# Patient Record
Sex: Female | Born: 1980 | Race: White | Hispanic: No | Marital: Married | Smoking: Never smoker
Health system: Southern US, Community
[De-identification: ages and names within clinical notes are randomized; demographics above are authoritative.]

## PROBLEM LIST (undated history)

## (undated) DIAGNOSIS — Z789 Other specified health status: Secondary | ICD-10-CM

## (undated) HISTORY — PX: TONSILLECTOMY: SUR1361

---

## 2003-09-11 ENCOUNTER — Other Ambulatory Visit: Admission: RE | Admit: 2003-09-11 | Discharge: 2003-09-11 | Payer: Self-pay | Admitting: *Deleted

## 2004-01-13 ENCOUNTER — Other Ambulatory Visit: Admission: RE | Admit: 2004-01-13 | Discharge: 2004-01-13 | Payer: Self-pay | Admitting: *Deleted

## 2004-06-22 ENCOUNTER — Other Ambulatory Visit: Admission: RE | Admit: 2004-06-22 | Discharge: 2004-06-22 | Payer: Self-pay | Admitting: *Deleted

## 2011-11-10 ENCOUNTER — Other Ambulatory Visit: Payer: Self-pay

## 2012-02-17 ENCOUNTER — Inpatient Hospital Stay (HOSPITAL_COMMUNITY): Payer: BC Managed Care – PPO

## 2012-02-17 ENCOUNTER — Encounter (HOSPITAL_COMMUNITY): Payer: Self-pay | Admitting: *Deleted

## 2012-02-17 ENCOUNTER — Inpatient Hospital Stay (HOSPITAL_COMMUNITY)
Admission: AD | Admit: 2012-02-17 | Discharge: 2012-02-18 | DRG: 380 | Disposition: A | Discharge status: Home / Self Care | Payer: BC Managed Care – PPO | Source: Ambulatory Visit | Attending: Obstetrics & Gynecology | Admitting: Obstetrics & Gynecology

## 2012-02-17 DIAGNOSIS — O4590 Premature separation of placenta, unspecified, unspecified trimester: Secondary | ICD-10-CM | POA: Diagnosis present

## 2012-02-17 DIAGNOSIS — IMO0001 Reserved for inherently not codable concepts without codable children: Secondary | ICD-10-CM

## 2012-02-17 DIAGNOSIS — O4692 Antepartum hemorrhage, unspecified, second trimester: Secondary | ICD-10-CM

## 2012-02-17 DIAGNOSIS — O4290 Premature rupture of membranes, unspecified as to length of time between rupture and onset of labor, unspecified weeks of gestation: Secondary | ICD-10-CM | POA: Diagnosis present

## 2012-02-17 DIAGNOSIS — O021 Missed abortion: Principal | ICD-10-CM | POA: Diagnosis present

## 2012-02-17 HISTORY — DX: Other specified health status: Z78.9

## 2012-02-17 LAB — CBC
HCT: 38 % (ref 36.0–46.0)
HCT: 35.2 % — ABNORMAL LOW (ref 36.0–46.0)
Hemoglobin: 12.2 g/dL (ref 12.0–15.0)
RBC: 4.28 MIL/uL (ref 3.87–5.11)
RDW: 13.1 % (ref 11.5–15.5)
RDW: 13 % (ref 11.5–15.5)
WBC: 14.4 10*3/uL — ABNORMAL HIGH (ref 4.0–10.5)
WBC: 16.4 10*3/uL — ABNORMAL HIGH (ref 4.0–10.5)

## 2012-02-17 LAB — COMPREHENSIVE METABOLIC PANEL
AST: 14 U/L (ref 0–37)
Albumin: 2.8 g/dL — ABNORMAL LOW (ref 3.5–5.2)
Calcium: 8.9 mg/dL (ref 8.4–10.5)
Creatinine, Ser: 0.56 mg/dL (ref 0.50–1.10)
GFR calc non Af Amer: >90 mL/min (ref >90)

## 2012-02-17 LAB — ABO/RH: ABO/RH(D): B POS

## 2012-02-17 LAB — DIC (DISSEMINATED INTRAVASCULAR COAGULATION)PANEL
INR: 0.99 (ref 0.00–1.49)
Platelets: 191 10*3/uL (ref 150–400)
Smear Review: NONE SEEN

## 2012-02-17 LAB — DIC (DISSEMINATED INTRAVASCULAR COAGULATION) PANEL
D-Dimer, Quant: 1.32 ug/mL-FEU — ABNORMAL HIGH (ref 0.00–0.48)
Fibrinogen: 323 mg/dL (ref 204–475)

## 2012-02-17 LAB — RPR: RPR Ser Ql: NONREACTIVE

## 2012-02-17 LAB — PREPARE RBC (CROSSMATCH)

## 2012-02-17 MED ORDER — TETANUS-DIPHTH-ACELL PERTUSSIS 5-2.5-18.5 LF-MCG/0.5 IM SUSP: 0.5000 mL | Freq: Once | INTRAMUSCULAR | Status: DC

## 2012-02-17 MED ORDER — FLEET ENEMA 7-19 GM/118ML RE ENEM: 1.0000 | ENEMA | Freq: Every day | RECTAL | Status: DC | PRN

## 2012-02-17 MED ORDER — OXYCODONE-ACETAMINOPHEN 5-325 MG PO TABS: 1.0000 | ORAL_TABLET | ORAL | Status: DC | PRN

## 2012-02-17 MED ORDER — BUTORPHANOL TARTRATE 1 MG/ML IJ SOLN
1.0000 mg | Freq: Once | INTRAMUSCULAR | Status: AC
  Administered 2012-02-17: 1 mg via INTRAVENOUS

## 2012-02-17 MED ORDER — BENZOCAINE-MENTHOL 20-0.5 % EX AERO: 1.0000 "application " | INHALATION_SPRAY | CUTANEOUS | Status: DC | PRN

## 2012-02-17 MED ORDER — LANOLIN HYDROUS EX OINT: TOPICAL_OINTMENT | CUTANEOUS | Status: DC | PRN

## 2012-02-17 MED ORDER — ONDANSETRON HCL 4 MG/2ML IJ SOLN: 4.0000 mg | Freq: Four times a day (QID) | INTRAMUSCULAR | Status: DC | PRN

## 2012-02-17 MED ORDER — SIMETHICONE 80 MG PO CHEW: 80.0000 mg | CHEWABLE_TABLET | ORAL | Status: DC | PRN

## 2012-02-17 MED ORDER — ONDANSETRON HCL 4 MG/2ML IJ SOLN: 4.0000 mg | INTRAMUSCULAR | Status: DC | PRN

## 2012-02-17 MED ORDER — CITRIC ACID-SODIUM CITRATE 334-500 MG/5ML PO SOLN: 30.0000 mL | ORAL | Status: DC | PRN

## 2012-02-17 MED ORDER — ACETAMINOPHEN 325 MG PO TABS: 650.0000 mg | ORAL_TABLET | ORAL | Status: DC | PRN

## 2012-02-17 MED ORDER — LACTATED RINGERS IV BOLUS (SEPSIS)
1000.0000 mL | Freq: Once | INTRAVENOUS | Status: AC
  Administered 2012-02-17: 1000 mL via INTRAVENOUS

## 2012-02-17 MED ORDER — LACTATED RINGERS IV SOLN: INTRAVENOUS | Status: DC

## 2012-02-17 MED ORDER — ZOLPIDEM TARTRATE 5 MG PO TABS: 5.0000 mg | ORAL_TABLET | Freq: Every evening | ORAL | Status: DC | PRN

## 2012-02-17 MED ORDER — SODIUM CHLORIDE 0.9 % IV SOLN
2.0000 g | Freq: Four times a day (QID) | INTRAVENOUS | Status: DC
  Administered 2012-02-17: 2 g via INTRAVENOUS
  Filled 2012-02-17 (×4): qty 2000

## 2012-02-17 MED ORDER — IBUPROFEN 600 MG PO TABS
600.0000 mg | ORAL_TABLET | Freq: Four times a day (QID) | ORAL | Status: DC
  Administered 2012-02-17 – 2012-02-18 (×3): 600 mg via ORAL
  Filled 2012-02-17 (×3): qty 1

## 2012-02-17 MED ORDER — AZITHROMYCIN 250 MG PO TABS: 500.0000 mg | ORAL_TABLET | Freq: Every day | ORAL | Status: DC

## 2012-02-17 MED ORDER — DEXTROSE 5 % IV SOLN
500.0000 mg | INTRAVENOUS | Status: DC
  Administered 2012-02-17: 500 mg via INTRAVENOUS
  Filled 2012-02-17: qty 500

## 2012-02-17 MED ORDER — AMOXICILLIN 500 MG PO CAPS: 500.0000 mg | ORAL_CAPSULE | Freq: Three times a day (TID) | ORAL | Status: DC

## 2012-02-17 MED ORDER — LACTATED RINGERS IV SOLN: 500.0000 mL | INTRAVENOUS | Status: DC | PRN

## 2012-02-17 MED ORDER — PRENATAL MULTIVITAMIN CH
1.0000 | ORAL_TABLET | Freq: Every day | ORAL | Status: DC
  Administered 2012-02-17: 1 via ORAL
  Filled 2012-02-17: qty 1

## 2012-02-17 MED ORDER — IBUPROFEN 600 MG PO TABS: 600.0000 mg | ORAL_TABLET | Freq: Four times a day (QID) | ORAL | Status: DC | PRN

## 2012-02-17 MED ORDER — DIBUCAINE 1 % RE OINT: 1.0000 "application " | TOPICAL_OINTMENT | RECTAL | Status: DC | PRN

## 2012-02-17 MED ORDER — ONDANSETRON HCL 4 MG PO TABS: 4.0000 mg | ORAL_TABLET | ORAL | Status: DC | PRN

## 2012-02-17 MED ORDER — WITCH HAZEL-GLYCERIN EX PADS: 1.0000 "application " | MEDICATED_PAD | CUTANEOUS | Status: DC | PRN

## 2012-02-17 MED ORDER — BISACODYL 10 MG RE SUPP: 10.0000 mg | Freq: Every day | RECTAL | Status: DC | PRN

## 2012-02-17 MED ORDER — FLEET ENEMA 7-19 GM/118ML RE ENEM: 1.0000 | ENEMA | RECTAL | Status: DC | PRN

## 2012-02-17 MED ORDER — OXYTOCIN 40 UNITS IN LACTATED RINGERS INFUSION - SIMPLE MED
62.5000 mL/h | INTRAVENOUS | Status: DC
  Filled 2012-02-17 (×2): qty 1000

## 2012-02-17 MED ORDER — LIDOCAINE HCL (PF) 1 % IJ SOLN: 30.0000 mL | INTRAMUSCULAR | Status: DC | PRN

## 2012-02-17 MED ORDER — DIPHENHYDRAMINE HCL 25 MG PO CAPS: 25.0000 mg | ORAL_CAPSULE | Freq: Four times a day (QID) | ORAL | Status: DC | PRN

## 2012-02-17 MED ORDER — OXYTOCIN BOLUS FROM INFUSION: 500.0000 mL | INTRAVENOUS | Status: DC

## 2012-02-17 MED ORDER — BUTORPHANOL TARTRATE 1 MG/ML IJ SOLN
INTRAMUSCULAR | Status: AC
  Administered 2012-02-17: 1 mg via INTRAVENOUS
  Filled 2012-02-17: qty 1

## 2012-02-17 MED ORDER — SENNOSIDES-DOCUSATE SODIUM 8.6-50 MG PO TABS
2.0000 | ORAL_TABLET | Freq: Every day | ORAL | Status: DC
  Administered 2012-02-17: 2 via ORAL

## 2012-02-17 NOTE — Progress Notes (Signed)
I received a call from pt's nurse after baby had delivered.  Judeth Cornfield and Kipp Brood were holding their son and were appropriately grieving.  They asked for a prayer for their baby.  I offered a prayer and I also created a space for them to voice their own prayers.  They requested that a priest be called.  I contacted the parish that is on-call this month and I am waiting for a phone call back to let me know if/when they will be able to come.    [employer] Pager, [phone] 1:45 PM   02/17/12 1300  Clinical Encounter Type  Visited With Patient and family together  Visit Type Spiritual support;Death  Referral From Nurse  Spiritual Encounters  Spiritual Needs Prayer

## 2012-02-17 NOTE — MAU Note (Signed)
Cramping and bleeding

## 2012-02-17 NOTE — MAU Provider Note (Signed)
Chief Complaint:  Vaginal Bleeding   None     HPI: Donna Mitchell is a 32 y.o. G1P0 at 85w1dwho presents to maternity admissions reporting cramping x4-5 hours with onset of vaginal bleeding described as bright red, when wiping, and staining her underwear. Upon arrival in MAU, pt leaned over side of bed to vomit, was unable to vomit but felt a pop and gush of fluid.  Large amount of bright red blood and clear fluid leakage noted on bed.  Pad place under pt.  She reports fetal movement prior to arrival in MAU, denies vaginal itching/burning, urinary symptoms, h/a, dizziness, or fever/chills.     Past Medical History: No past medical history on file.   Past Surgical History: No past surgical history on file.  Family History: No family history on file.  Social History: History  Substance Use Topics  . Smoking status: Not on file  . Smokeless tobacco: Not on file  . Alcohol Use: Not on file    Allergies: No Known Allergies  Meds:  No prescriptions prior to admission    ROS: Pertinent findings in history of present illness.  Physical Exam  Blood pressure 138/95, pulse 79, temperature 97.3 F (36.3 C), temperature source Oral, resp. rate 20, height 5\' 5"  (1.651 m), weight 76.204 kg (168 lb).  GENERAL: Well-developed, well-nourished female in no acute distress.  HEENT: normocephalic HEART: normal rate RESP: normal effort ABDOMEN: Soft, non-tender, gravid appropriate for gestational age EXTREMITIES: Nontender, no edema NEURO: alert and oriented Pelvic exam: Unable to visualize cervix due to large amount of blood with 3-4 large clots removed during speculum exam.  Blood running over speculum on to bed during exam.     FHT obtained by doppler upon arrival and FHR 132 during bedside sono   Labs: CBC and type and screen pending  Imaging:  Bedside U/S indicates FHR, no active fetal movement, subjectively low amniotic fluid.  Formal U/S paged to bedside for  evaluation.  Formal U/S with FHR 132, no measurable amniotic fluid, large amount blood in uterus, and large clots making visualization of cervix difficult  Assessment: 1. Second trimester bleeding     Plan: LR 1000 ml bolus Called Dr Langston Masker with assessment and findings Dr Langston Masker in MAU to see pt  Sharen Counter Certified Nurse-Midwife 02/17/2012 6:04 AM

## 2012-02-17 NOTE — MAU Note (Signed)
Pt's husband comes to door for RN, pt reports she felt nauseated and when she gagged she felt a gush of something. Large amount of blood and ?clear fluid noted in bed. Provider notified. pericare given, new pad applied.

## 2012-02-17 NOTE — H&P (Signed)
Donna Mitchell is a 32 y.o. female presenting for abdominal pain and cramping which started last pm.  She then began having vaginal bleeding and presented for further evaluation.  On arrival to MAU at around 6am, the pt felt nauseous and had a gush of bloody fluid.  Her bleeding has continued since that time along with the crampy abdominal pain, not severe.  Records are unavailable for review however, the pt reports an uncomplicated pregnancy up until this point.    Ultrasound was performed with prelim read revealing questionable abruption, FHT 132, large amount of intrauterine blood, low AFI.  Maternal Medical History:  Reason for admission: Reason for Admission:   nauseaPrenatal complications: no prenatal complications Prenatal Complications - Diabetes: none.    OB History    Grav Para Term Preterm Abortions TAB SAB Ect Mult Living   1              Past Medical History  Diagnosis Date  . No pertinent past medical history    Past Surgical History  Procedure Date  . Tonsillectomy    Family History: family history is not on file. Social History:  reports that she has never smoked. She does not have any smokeless tobacco history on file. She reports that she does not drink alcohol or use illicit drugs.   Prenatal Transfer Tool  Maternal Diabetes: No Genetic Screening: Normal Maternal Ultrasounds/Referrals: Normal Fetal Ultrasounds or other Referrals:  None Maternal Substance Abuse:  No Significant Maternal Medications:  None Significant Maternal Lab Results:  None Other Comments:  None  Review of Systems  Constitutional: Negative for fever and chills.  Cardiovascular: Negative for chest pain and palpitations.  Gastrointestinal: Positive for abdominal pain. Negative for nausea and vomiting.      Blood pressure 149/73, pulse 67, temperature 97.3 F (36.3 C), temperature source Oral, resp. rate 20, height 5\' 5"  (1.651 m), weight 168 lb (76.204 kg). Maternal Exam:    Abdomen: Patient reports the following abdominal tenderness: LLQ and RLQ.  Fundal height is c/w dates.    Introitus: Normal vulva. Normal vagina.  Amniotic fluid character: bloody.  Pelvis: adequate for delivery.   Cervix: Cervix evaluated by sterile speculum exam and digital exam.    Pad has been on x 15 min with scant blood. SSE: ~100cc of clot and bloody fluid, cvx visually 2cm.   SVE: 2/50/-3 with fetal parts palpable.     Physical Exam  Constitutional: She is oriented to person, place, and time. She appears well-developed and well-nourished.  HENT:  Head: Normocephalic and atraumatic.  Neck: Normal range of motion. Neck supple.  GI: Soft. She exhibits no distension. There is tenderness in the right lower quadrant and left lower quadrant.  Genitourinary: Vagina normal and uterus normal.  Musculoskeletal: Normal range of motion.  Neurological: She is alert and oriented to person, place, and time.  Skin: Skin is warm and dry.  Psychiatric: She has a normal mood and affect. Her behavior is normal.    Prenatal labs: ABO, Rh:   Antibody:   Rubella:   RPR:    HBsAg:    HIV:    GBS:     Assessment/Plan: 31yo G1 at [redacted]w[redacted]d with likely abruption, PPROM -Awaiting labs -Admit to L&D for stabilization and likely imminent delivery  Louna Rothgeb 02/17/2012, 7:03 AM

## 2012-02-17 NOTE — Progress Notes (Signed)
Patient ID: Donna Mitchell, female   DOB: 07/13/1980, 32 y.o.   MRN: [id number] Call to see patient with increased bleeding On check fetus was present in the vagina Had vag delivery of nonviable female infant  No gross abnormalities Placenta intact no sign of abruption sent to path No tears or lacerations

## 2012-02-17 NOTE — Progress Notes (Signed)
At the request of the family, I contacted [facility] and spoke with Father Marcaccio who will be coming over in about an hour when he is able to get here.  I spoke with the family and the nursing team and that time frame will work okay.  Family was appreciative that we were able to connect them with a priest.  Kathleen Argue Pager, [phone] 2:07 PM

## 2012-02-17 NOTE — MAU Note (Signed)
Bedside u/s

## 2012-02-17 NOTE — Progress Notes (Signed)
Patient ID: Donna Mitchell, female   DOB: [birth year], 32 y.o.   MRN: [id number] Bleeding had stopped Patient is stable Labs noted Will watch expectantly procede with delivery for increased bleeding. Infection or fetal demise If urgent will precede with hysterotomy Discussed with patient and husband

## 2012-02-18 DIAGNOSIS — O4290 Premature rupture of membranes, unspecified as to length of time between rupture and onset of labor, unspecified weeks of gestation: Secondary | ICD-10-CM | POA: Diagnosis present

## 2012-02-18 DIAGNOSIS — O4590 Premature separation of placenta, unspecified, unspecified trimester: Secondary | ICD-10-CM | POA: Diagnosis present

## 2012-02-18 DIAGNOSIS — IMO0001 Reserved for inherently not codable concepts without codable children: Secondary | ICD-10-CM

## 2012-02-18 LAB — TYPE AND SCREEN
Antibody Screen: NEGATIVE
Unit division: 0

## 2012-02-18 LAB — CBC
Hemoglobin: 10.8 g/dL — ABNORMAL LOW (ref 12.0–15.0)
MCH: 31 pg (ref 26.0–34.0)
MCHC: 34.7 g/dL (ref 30.0–36.0)
MCV: 89.4 fL (ref 78.0–100.0)
RBC: 3.48 MIL/uL — ABNORMAL LOW (ref 3.87–5.11)

## 2012-02-18 NOTE — Discharge Summary (Signed)
  Admitting diagnosis: Intrauterine pregnancy at 21-1/7 with vaginal bleeding and a premature rupture of membranes  Discharge diagnosis: The same with a previable delivery of a female infant  Complete history and physical please see dictated  Course in the hospital: Patient was admitted at 21-1/7 with vaginal bleeding and premature rupture of membranes. Antibiotics were begun. Patient went on to spontaneously deliver a previable female infant. Son was delivered intact it was sent for pathology. The patient was observed overnight. She remained afebrile with stable vital signs. Her fundus was firm and nontender. Lochia was normal. Repeat CBC was unremarkable except for an elevated white count.  Terms of complications they are as noted above  Patient discharged home in stable condition  Disposition: Patient avoid vaginal entrance. Started to call should there be heavy vaginal bleeding. She can't any signs of infection with temperatures over 100. Strength in signs and symptoms of deep venous thrombosis and pulmonary embolus. Patient will continue prenatal vitamins and followup the office in 4 weeks

## 2012-02-18 NOTE — Progress Notes (Signed)
Post Partum Day one Subjective: no complaints  Objective: Blood pressure 129/85, pulse 76, temperature 97.6 F (36.4 C), temperature source Oral, resp. rate 18, height 5\' 5"  (1.651 m), weight 76.204 kg (168 lb), SpO2 100.00%.  Physical Exam:  General: alert Lochia: appropriate Uterine Fundus: firm Incision: na  DVT Evaluation: No evidence of DVT seen on physical exam.   Basename 02/18/12 0525 02/17/12 0938  HGB 10.8* 12.2  HCT 31.1* 35.2*    Assessment/Plan: Discharge home   LOS: 1 day   Rayanne Padmanabhan S 02/18/2012, 8:39 AM

## 2012-02-18 NOTE — Progress Notes (Signed)
Discharge instructions given patient and significant other at bedside.  Mediations, activity instructions, breast care, follow up appointments and community resources discussed.  No questions at this time.  Paitent left unit in stable condition with personal belongings accompanied by staff.  Osvaldo Angst, RN------

## 2012-03-20 ENCOUNTER — Encounter (HOSPITAL_COMMUNITY): Payer: BC Managed Care – PPO

## 2012-03-21 ENCOUNTER — Encounter (HOSPITAL_COMMUNITY): Payer: Self-pay | Admitting: Obstetrics & Gynecology

## 2012-03-27 ENCOUNTER — Ambulatory Visit (HOSPITAL_COMMUNITY)
Admission: RE | Admit: 2012-03-27 | Discharge: 2012-03-27 | Disposition: A | Discharge status: Home / Self Care | Payer: BC Managed Care – PPO | Source: Ambulatory Visit | Attending: Obstetrics & Gynecology | Admitting: Obstetrics & Gynecology

## 2012-03-27 DIAGNOSIS — Z3169 Encounter for other general counseling and advice on procreation: Secondary | ICD-10-CM | POA: Insufficient documentation

## 2012-03-27 DIAGNOSIS — Z8751 Personal history of pre-term labor: Secondary | ICD-10-CM | POA: Insufficient documentation

## 2012-03-27 NOTE — Progress Notes (Signed)
Maternal Fetal Medicine Consultation Requesting Provider(s): [facility], [place], [place] Primary OB: Dr Vickey Sages Reason for consultation: Preconception counseling, history of one preterm delivery  HPI: 32 yo G1 P0 s/p PTD at 64 1/7 weeks. Patient reported pain overnight and vaginal bleeding. She woke up and went to the hospital in the morning because of persist vaginal bleeding and pain. Upon presentation, she was found to have a closed cervix, but spontaneously ruptured membranes and spontaneously delivered 4hrs later.  The placenta was delivered intact and pathology evaluation reveled evidence of placental abruption.  OB History: G1 as above PMH: History reviewed. No pertinent past medical history PSH: History reviewed. No pertinent past surgical history Meds: PNV Allergies: Denies any food or drug allergies FH: History reviewed. No pertinent family history of diabetes, hypertension, cancers, heart disease, genetic abnormalities, and birth defects. Siblings have no children and are 8+ years younger than she is Soc: Denies any personal use or exposure to tobacco, alcohol, and illegal drugs.  Review of Systems: no vaginal bleeding or cramping, no nausea/vomiting. All other systems reviewed and are negative  Labs: Available data personally reviewed PE:  GEN: well-appearing female, resting in chair in no acute distress Ultrasound: Not performed at this visit.   A/P: 32 yo G1 P0 s/p PPROM and PTD at 45 1/7 weeks, with likely diagnosis of placental abruption.  Due to her history of one preterm delivery, it is recommended that she receive therapy with progesterone for prevention of recurrent preterm birth, as well as serial cervical lengths. If desires to use medical management, 17-hydroxy progesterone caproate 250mg  should be given as weekly i.m. injections from 15-[redacted] weeks gestation. Cervical length should be performed at baseline 15-16 weeks, and surveillance should continue ever y 1-2  weeks until 30weeks. We discussed the many causes of recurrent pregnancy loss which could be evaluated. Factors including but not limited to anti-phospholipid syndrome, and abnormal karyotype were discussed with patient.    Additionally you may consider gynecologic ultrasound for evaluation of possible uterine malformation or septum.  I would recommend further evaluation and treatment with a Reproductive Endocrinologist if an anomaly is discovered.  Summary of Recommendations - Consider sonohysterography for assessment of uterine abnormalities  - Anticardiolipin antibody (IgG and IgM) titer and lupus anticoagulant and anti-beta2 glycoprotein antibodies (IgG and IgM), the evaluation for anti-phospholipid syndrome, performed twice, twelve weeks apart  - Get up to day on all vaccinations - Continue PNV, Folic acid, Vitamin D and calcium - Avoid cats and feces, as well as environmental toxins - Delay pregnancy for approximately 12-18 months, as short inter-pregnancy interval has been shown to increase the risk of future preterm delivery - When pregnant, initiate serial cervical length, and 17OH-progesterone. We don't recommend prophylactic cerclage placement unless it is ultrasound indicated.   Case reviewed with Dr. Claudean Severance  Thank you for the opportunity to be a part of the care of [name].  We will continue to follow her in our [facility] if desired. Please contact our office if we can be of further assistance.   I spent approximately 30 minutes with this patient with over 50% of time spent in face-to-face counseling.

## 2013-04-03 LAB — OB RESULTS CONSOLE HIV ANTIBODY (ROUTINE TESTING): HIV: NONREACTIVE

## 2013-04-03 LAB — OB RESULTS CONSOLE RPR: RPR: NONREACTIVE

## 2013-04-03 LAB — OB RESULTS CONSOLE RUBELLA ANTIBODY, IGM: RUBELLA: IMMUNE

## 2013-04-03 LAB — OB RESULTS CONSOLE GC/CHLAMYDIA
CHLAMYDIA, DNA PROBE: NEGATIVE
Gonorrhea: NEGATIVE

## 2013-04-03 LAB — OB RESULTS CONSOLE HEPATITIS B SURFACE ANTIGEN: HEP B S AG: NEGATIVE

## 2013-04-11 ENCOUNTER — Inpatient Hospital Stay (HOSPITAL_COMMUNITY)
Admission: AD | Admit: 2013-04-11 | Payer: BC Managed Care – PPO | Source: Ambulatory Visit | Admitting: Obstetrics & Gynecology

## 2013-04-19 LAB — OB RESULTS CONSOLE GBS: GBS: POSITIVE

## 2013-05-08 ENCOUNTER — Other Ambulatory Visit: Payer: Self-pay

## 2013-05-22 ENCOUNTER — Other Ambulatory Visit (HOSPITAL_COMMUNITY): Payer: Self-pay | Admitting: Obstetrics & Gynecology

## 2013-05-22 ENCOUNTER — Encounter (HOSPITAL_COMMUNITY): Payer: Self-pay | Admitting: Obstetrics & Gynecology

## 2013-05-22 DIAGNOSIS — Z3689 Encounter for other specified antenatal screening: Secondary | ICD-10-CM

## 2013-05-22 DIAGNOSIS — O09212 Supervision of pregnancy with history of pre-term labor, second trimester: Secondary | ICD-10-CM

## 2013-06-11 ENCOUNTER — Encounter (HOSPITAL_COMMUNITY): Payer: Self-pay

## 2013-06-11 ENCOUNTER — Other Ambulatory Visit (HOSPITAL_COMMUNITY): Payer: Self-pay | Admitting: Obstetrics & Gynecology

## 2013-06-11 ENCOUNTER — Ambulatory Visit (HOSPITAL_COMMUNITY)
Admission: RE | Admit: 2013-06-11 | Discharge: 2013-06-11 | Disposition: A | Discharge status: Home / Self Care | Payer: BC Managed Care – PPO | Source: Ambulatory Visit | Attending: Obstetrics & Gynecology | Admitting: Obstetrics & Gynecology

## 2013-06-11 ENCOUNTER — Ambulatory Visit (HOSPITAL_COMMUNITY)
Admission: RE | Admit: 2013-06-11 | Discharge: 2013-06-11 | Disposition: A | Discharge status: Home / Self Care | Payer: BC Managed Care – PPO | Source: Ambulatory Visit | Attending: Obstetrics and Gynecology | Admitting: Obstetrics and Gynecology

## 2013-06-11 DIAGNOSIS — O09299 Supervision of pregnancy with other poor reproductive or obstetric history, unspecified trimester: Secondary | ICD-10-CM

## 2013-06-11 DIAGNOSIS — O09212 Supervision of pregnancy with history of pre-term labor, second trimester: Secondary | ICD-10-CM

## 2013-06-11 DIAGNOSIS — O3500X0 Maternal care for (suspected) central nervous system malformation or damage in fetus, unspecified, not applicable or unspecified: Secondary | ICD-10-CM | POA: Insufficient documentation

## 2013-06-11 DIAGNOSIS — O4700 False labor before 37 completed weeks of gestation, unspecified trimester: Secondary | ICD-10-CM | POA: Insufficient documentation

## 2013-06-11 DIAGNOSIS — O209 Hemorrhage in early pregnancy, unspecified: Secondary | ICD-10-CM | POA: Insufficient documentation

## 2013-06-11 DIAGNOSIS — O09899 Supervision of other high risk pregnancies, unspecified trimester: Secondary | ICD-10-CM

## 2013-06-11 DIAGNOSIS — O350XX0 Maternal care for (suspected) central nervous system malformation in fetus, not applicable or unspecified: Secondary | ICD-10-CM | POA: Insufficient documentation

## 2013-06-11 DIAGNOSIS — Z3689 Encounter for other specified antenatal screening: Secondary | ICD-10-CM

## 2013-06-11 DIAGNOSIS — O09219 Supervision of pregnancy with history of pre-term labor, unspecified trimester: Principal | ICD-10-CM

## 2013-06-11 NOTE — Progress Notes (Signed)
[facility]  Indication: 33 yr old G2P0100 at 856w0d with history of 21 week delivery with suspected abruption for fetal anatomic survey.  Findings: 1. Single intrauterine pregnancy. 2. Fetal biometry is consistent with dating. 3. Posterior placenta without evidence of previa. 4. Normal amniotic fluid volume. 5. Normal transvaginal cervical length. 6. The views of the cavum, ventricles, palate, transverse spine, diaphragm, and heart are limited. 7. There is a choroid plexus cyst. 8. The remainder of the limited anatomy survey is normal.  Recommendations: 1. Appropriate fetal growth. 2. Limited anatomy survey: - recommend follow up in 2 weeks to complete anatomic survey 3. Choroid plexus cyst: - see consult letter - had normal first trimester screen - declined further testing 4. History of preterm delivery/ suspected abruption: - see consult letter - recommend continue cervical length surveillance every 2 weeks until 24-28 weeks; normal today - on 17OH progesterone - recommend start low dose aspirin - recommend check antiphospholipid antibody panel - recommend serial fetal growth   Eulis FosterKristen Mannat Benedetti, MD

## 2013-06-11 NOTE — Consult Note (Signed)
MFM consult  33 yr old G2P0100 at 875w0d with history of 21 week delivery with suspected abruption referred by Dr. Langston MaskerMorris for fetal anatomic survey and consult.  Past OB hx: 02/2012- presented at 21 weeks with bleeding and painful contractions and delivered a previable infant; pathology consistent with abruption PMH: none PSH: none Social history: negative  Patient reports no complications this pregnancy.  Ultrasound today shows: single intrauterine pregnancy. Fetal biometry is consistent with dating. Posterior placenta without evidence of previa. Normal amniotic fluid volume. Normal transvaginal cervical length. The views of the cavum, ventricles, palate, transverse spine, diaphragm, and heart are limited. There is a choroid plexus cyst. The remainder of the limited anatomy survey is normal.  I discussed the findings of the ultrasound with the patient and counseled her as follows:  1. Appropriate fetal growth. 2. Limited anatomy survey: - recommend follow up in 2 weeks to complete anatomic survey 3. Choroid plexus cyst:  I counseled the patient that this can be associated with a slightly higher risk of fetal aneuploidy (in particular trisomy 18). I discussed that with an otherwise normal anatomic survey and normal screening the risk of the fetus having trisomy 4818 is low. I reiterated that we could not complete a full anatomic survey today secondary to fetal position but no abnormalities were seen. We will complete the anatomic survey in 2 weeks.  The patient had a first trimester screen which showed risk of trisomy 13/18 of <1:10,000. Discussed limitations of screening tests in detecting fetal aneuploidy. Patient declined further testing.  I discussed that isolated choroid plexus cysts detected prenatally are not associated with long term adverse neurodevelopmental outcomes.  4. History of preterm delivery/ suspected abruption: - I discussed that with a history of preterm delivery the risk  is increased in future pregnancies. Studies have shown that the frequency of recurrent preterm birth was 14 to 22 % after one preterm delivery, 28 to 42 % after two preterm deliveries, and 67% after three preterm deliveries. Term births decreased the risk of preterm birth in subsequent pregnancies. I also explained to the patient that given her history it seems as though she may have had an abruption which subsequently led to PPROM in the delivery that occurred at 21 weeks. I explained that I would recommend weekly injections of 17 OH progesterone as use from 16-36 weeks has been shown to decrease the risk of recurrent preterm birth by up to 30%. Although if there was an abruption in that previous pregnancy her risk of recurrent abruption may be as high as 15% and progesterone would not decrease that risk. I discussed we do not know of any adverse effects of progesterone however long term data is limited. Patient is already on 3317 OH progesterone.  I discussed that her cervical length is normal on today's ultrasound. I recommend close surveillance for development of signs/symptoms of preterm labor/PPROM. Recommend follow up ultrasound in 2 weeks for cervical length. I would recommend serial cervical lengths until 24-[redacted] weeks gestation.  - discussed 15% recurrence risk of abruption and that low dose aspirin in high risk women has been shown to reduce recurrence risk; recommend start low dose aspirin (81mg /day) - recommend check antiphospholipid antibody panel- lupus anticoagulant, anti cardiolipin antibodies, and beta 2 glycoprotein - recommend serial fetal growth  - recommend surveillance for the development of signs/symptoms of abruption  I spent a total of 45 minutes with the patient of which >50% was in face to face consultation.  Please call with questions.  Eulis FosterKristen Jani Moronta, MD

## 2013-06-25 ENCOUNTER — Other Ambulatory Visit (HOSPITAL_COMMUNITY): Payer: Self-pay | Admitting: Obstetrics & Gynecology

## 2013-06-25 ENCOUNTER — Other Ambulatory Visit (HOSPITAL_COMMUNITY): Payer: Self-pay | Admitting: Maternal and Fetal Medicine

## 2013-06-25 ENCOUNTER — Ambulatory Visit (HOSPITAL_COMMUNITY)
Admission: RE | Admit: 2013-06-25 | Discharge: 2013-06-25 | Disposition: A | Discharge status: Home / Self Care | Payer: BC Managed Care – PPO | Source: Ambulatory Visit | Attending: Obstetrics & Gynecology | Admitting: Obstetrics & Gynecology

## 2013-06-25 DIAGNOSIS — O09299 Supervision of pregnancy with other poor reproductive or obstetric history, unspecified trimester: Secondary | ICD-10-CM

## 2013-06-25 DIAGNOSIS — Z3689 Encounter for other specified antenatal screening: Secondary | ICD-10-CM | POA: Insufficient documentation

## 2013-07-09 ENCOUNTER — Encounter (HOSPITAL_COMMUNITY): Payer: Self-pay

## 2013-07-09 ENCOUNTER — Ambulatory Visit (HOSPITAL_COMMUNITY)
Admission: RE | Admit: 2013-07-09 | Discharge: 2013-07-09 | Disposition: A | Discharge status: Home / Self Care | Payer: BC Managed Care – PPO | Source: Ambulatory Visit | Attending: Obstetrics & Gynecology | Admitting: Obstetrics & Gynecology

## 2013-07-09 DIAGNOSIS — O09299 Supervision of pregnancy with other poor reproductive or obstetric history, unspecified trimester: Secondary | ICD-10-CM | POA: Insufficient documentation

## 2013-07-09 DIAGNOSIS — Z3689 Encounter for other specified antenatal screening: Secondary | ICD-10-CM | POA: Insufficient documentation

## 2013-07-23 ENCOUNTER — Encounter (HOSPITAL_COMMUNITY): Payer: Self-pay

## 2013-07-23 ENCOUNTER — Ambulatory Visit (HOSPITAL_COMMUNITY)
Admission: RE | Admit: 2013-07-23 | Discharge: 2013-07-23 | Disposition: A | Discharge status: Home / Self Care | Payer: BC Managed Care – PPO | Source: Ambulatory Visit | Attending: Maternal and Fetal Medicine | Admitting: Maternal and Fetal Medicine

## 2013-07-23 DIAGNOSIS — O09299 Supervision of pregnancy with other poor reproductive or obstetric history, unspecified trimester: Secondary | ICD-10-CM

## 2013-07-23 DIAGNOSIS — Z3689 Encounter for other specified antenatal screening: Secondary | ICD-10-CM | POA: Insufficient documentation

## 2013-08-06 ENCOUNTER — Encounter (HOSPITAL_COMMUNITY): Payer: Self-pay

## 2013-08-06 ENCOUNTER — Ambulatory Visit (HOSPITAL_COMMUNITY)
Admission: RE | Admit: 2013-08-06 | Discharge: 2013-08-06 | Disposition: A | Discharge status: Home / Self Care | Payer: BC Managed Care – PPO | Source: Ambulatory Visit | Attending: Obstetrics & Gynecology | Admitting: Obstetrics & Gynecology

## 2013-08-06 DIAGNOSIS — O09299 Supervision of pregnancy with other poor reproductive or obstetric history, unspecified trimester: Secondary | ICD-10-CM

## 2013-08-06 DIAGNOSIS — Z3689 Encounter for other specified antenatal screening: Secondary | ICD-10-CM | POA: Insufficient documentation

## 2013-08-20 ENCOUNTER — Ambulatory Visit (HOSPITAL_COMMUNITY)
Admission: RE | Admit: 2013-08-20 | Discharge: 2013-08-20 | Disposition: A | Discharge status: Home / Self Care | Payer: BC Managed Care – PPO | Source: Ambulatory Visit | Attending: Obstetrics & Gynecology | Admitting: Obstetrics & Gynecology

## 2013-08-20 ENCOUNTER — Encounter (HOSPITAL_COMMUNITY): Payer: Self-pay

## 2013-08-20 ENCOUNTER — Encounter (HOSPITAL_COMMUNITY)
Admission: RE | Admit: 2013-08-20 | Discharge: 2013-08-20 | Disposition: A | Discharge status: Home / Self Care | Payer: BC Managed Care – PPO | Source: Ambulatory Visit | Attending: Obstetrics & Gynecology | Admitting: Obstetrics & Gynecology

## 2013-08-20 DIAGNOSIS — O09299 Supervision of pregnancy with other poor reproductive or obstetric history, unspecified trimester: Secondary | ICD-10-CM

## 2013-08-20 DIAGNOSIS — Z3689 Encounter for other specified antenatal screening: Secondary | ICD-10-CM | POA: Insufficient documentation

## 2013-08-20 MED ORDER — BETAMETHASONE SOD PHOS & ACET 6 (3-3) MG/ML IJ SUSP
12.0000 mg | Freq: Once | INTRAMUSCULAR | Status: AC
  Administered 2013-08-20: 12 mg via INTRAMUSCULAR
  Filled 2013-08-20: qty 2

## 2013-08-21 ENCOUNTER — Ambulatory Visit (HOSPITAL_COMMUNITY)
Admission: RE | Admit: 2013-08-21 | Discharge: 2013-08-21 | Disposition: A | Discharge status: Home / Self Care | Payer: BC Managed Care – PPO | Source: Ambulatory Visit | Attending: Obstetrics & Gynecology | Admitting: Obstetrics & Gynecology

## 2013-08-21 DIAGNOSIS — O4590 Premature separation of placenta, unspecified, unspecified trimester: Secondary | ICD-10-CM | POA: Insufficient documentation

## 2013-08-21 DIAGNOSIS — O4290 Premature rupture of membranes, unspecified as to length of time between rupture and onset of labor, unspecified weeks of gestation: Secondary | ICD-10-CM | POA: Insufficient documentation

## 2013-08-21 DIAGNOSIS — O09299 Supervision of pregnancy with other poor reproductive or obstetric history, unspecified trimester: Secondary | ICD-10-CM | POA: Insufficient documentation

## 2013-08-21 MED ORDER — BETAMETHASONE SOD PHOS & ACET 6 (3-3) MG/ML IJ SUSP
12.0000 mg | Freq: Once | INTRAMUSCULAR | Status: AC
  Administered 2013-08-21: 12 mg via INTRAMUSCULAR
  Filled 2013-08-21: qty 2

## 2013-10-01 ENCOUNTER — Ambulatory Visit (INDEPENDENT_AMBULATORY_CARE_PROVIDER_SITE_OTHER): Payer: Self-pay | Admitting: Pediatrics

## 2013-10-01 DIAGNOSIS — Z7681 Expectant parent(s) prebirth pediatrician visit: Secondary | ICD-10-CM

## 2013-10-01 NOTE — Progress Notes (Signed)
33 year old CF expecting female [facility], considered high risk secondary to pregnancy loss and placental abruption at [redacted] weeks EGA about 19 months ago.  Uncomplicated pregnancy to date.  Discussed providers, hours, after hours contact info, and recommended vaccine schedule.  Answered mother's questions.

## 2013-11-02 ENCOUNTER — Inpatient Hospital Stay (HOSPITAL_COMMUNITY)
Admission: AD | Admit: 2013-11-02 | Discharge: 2013-11-02 | Disposition: A | Discharge status: Home / Self Care | Payer: BC Managed Care – PPO | Source: Ambulatory Visit | Attending: Obstetrics and Gynecology | Admitting: Obstetrics and Gynecology

## 2013-11-02 ENCOUNTER — Encounter (HOSPITAL_COMMUNITY): Payer: Self-pay | Admitting: *Deleted

## 2013-11-02 DIAGNOSIS — O9989 Other specified diseases and conditions complicating pregnancy, childbirth and the puerperium: Principal | ICD-10-CM

## 2013-11-02 DIAGNOSIS — O2690 Pregnancy related conditions, unspecified, unspecified trimester: Secondary | ICD-10-CM | POA: Diagnosis not present

## 2013-11-02 DIAGNOSIS — R030 Elevated blood-pressure reading, without diagnosis of hypertension: Secondary | ICD-10-CM | POA: Insufficient documentation

## 2013-11-02 DIAGNOSIS — O26899 Other specified pregnancy related conditions, unspecified trimester: Secondary | ICD-10-CM

## 2013-11-02 DIAGNOSIS — O3433 Maternal care for cervical incompetence, third trimester: Secondary | ICD-10-CM

## 2013-11-02 DIAGNOSIS — O99891 Other specified diseases and conditions complicating pregnancy: Secondary | ICD-10-CM | POA: Insufficient documentation

## 2013-11-02 DIAGNOSIS — N949 Unspecified condition associated with female genital organs and menstrual cycle: Secondary | ICD-10-CM | POA: Insufficient documentation

## 2013-11-02 DIAGNOSIS — R102 Pelvic and perineal pain: Secondary | ICD-10-CM

## 2013-11-02 LAB — CBC
HEMATOCRIT: 40.6 % (ref 36.0–46.0)
Hemoglobin: 14.6 g/dL (ref 12.0–15.0)
MCH: 31.3 pg (ref 26.0–34.0)
MCHC: 36 g/dL (ref 30.0–36.0)
MCV: 86.9 fL (ref 78.0–100.0)
Platelets: 178 10*3/uL (ref 150–400)
RBC: 4.67 MIL/uL (ref 3.87–5.11)
RDW: 13.7 % (ref 11.5–15.5)
WBC: 9.5 10*3/uL (ref 4.0–10.5)

## 2013-11-02 LAB — COMPREHENSIVE METABOLIC PANEL
ALT: 14 U/L (ref 0–35)
ANION GAP: 14 (ref 5–15)
AST: 21 U/L (ref 0–37)
Albumin: 2.7 g/dL — ABNORMAL LOW (ref 3.5–5.2)
Alkaline Phosphatase: 147 U/L — ABNORMAL HIGH (ref 39–117)
BUN: 7 mg/dL (ref 6–23)
CHLORIDE: 99 meq/L (ref 96–112)
CO2: 22 mEq/L (ref 19–32)
Calcium: 9 mg/dL (ref 8.4–10.5)
Creatinine, Ser: 0.59 mg/dL (ref 0.50–1.10)
GFR calc non Af Amer: >90 mL/min (ref >90)
GLUCOSE: 71 mg/dL (ref 70–99)
POTASSIUM: 4.2 meq/L (ref 3.7–5.3)
Sodium: 135 mEq/L — ABNORMAL LOW (ref 137–147)
Total Bilirubin: 0.7 mg/dL (ref 0.3–1.2)
Total Protein: 6.2 g/dL (ref 6.0–8.3)

## 2013-11-02 LAB — PROTEIN / CREATININE RATIO, URINE
Creatinine, Urine: 50.52 mg/dL
Protein Creatinine Ratio: 0.26 — ABNORMAL HIGH (ref 0.00–0.15)
Total Protein, Urine: 13.3 mg/dL

## 2013-11-02 LAB — URINALYSIS, ROUTINE W REFLEX MICROSCOPIC
Bilirubin Urine: NEGATIVE
Glucose, UA: NEGATIVE mg/dL
Ketones, ur: NEGATIVE mg/dL
NITRITE: NEGATIVE
Protein, ur: NEGATIVE mg/dL
Specific Gravity, Urine: 1.01 (ref 1.005–1.030)
UROBILINOGEN UA: 0.2 mg/dL (ref 0.0–1.0)
pH: 5.5 (ref 5.0–8.0)

## 2013-11-02 LAB — URINE MICROSCOPIC-ADD ON

## 2013-11-02 NOTE — MAU Provider Note (Signed)
Chief Complaint:  Pelvic Pain   First Provider Initiated Contact with Patient 11/02/13 1302      HPI: Donna Mitchell is a 33 y.o. G2P0100 at [redacted]w[redacted]d who presents to maternity admissions reporting new-onset today of pelvic pressure notably over mid symphysis. It is exacerbated by standing and walking, relieved by rest and sitting. The pain is constant but waxes and wanes. She is concerned because her cervix has been 4 cm dilated at office visits. Denies dysuria, hematuria, frequency or urgency of urination. Denies contractions, leakage of fluid or vaginal bleeding. Good fetal movement.   Pregnancy Course: Hx abruption and SOL at 21 wks. Had 17-P this pregnancy. BPs normal at visits. Dr. Langston Masker primary  Past Medical History: Past Medical History  Diagnosis Date  . No pertinent past medical history     Past obstetric history: OB History  Gravida Para Term Preterm AB SAB TAB Ectopic Multiple Living     # Outcome Date GA Lbr Len/2nd Weight Sex Delivery Anes PTL Lv  2 CUR           1 PRE 02/17/12 [redacted]w[redacted]d / 00:16 0.365 kg (12.9 oz) M SVD None  SB      Past Surgical History: Past Surgical History  Procedure Laterality Date  . Tonsillectomy       Family History: History reviewed. No pertinent family history.  Social History: History  Substance Use Topics  . Smoking status: Never Smoker   . Smokeless tobacco: Not on file  . Alcohol Use: No    Allergies: No Known Allergies  Meds:  Prescriptions prior to admission  Medication Sig Dispense Refill  . aspirin 81 MG chewable tablet Chew by mouth daily.      Marland Kitchen HYDROXYPROGESTERONE CAPROATE IM Inject into the muscle once a week.      . Prenatal Vit-Fe Fumarate-FA (PRENATAL MULTIVITAMIN) TABS Take 1 tablet by mouth daily.        ROS: Pertinent findings in history of present illness.  Physical Exam  Blood pressure 130/89, pulse 69, height  (1.651 m), weight 87.204 kg (192 lb 4 oz), last menstrual period  01/28/2013. Filed Vitals:   11/02/13 1226 11/02/13 1238 11/02/13 1240 11/02/13 1247  BP: 146/93 142/95 148/95 130/89  Pulse: 67 73 82 69  Height:      Weight:       GENERAL: Well-developed, well-nourished female in no acute distress.  HEENT: normocephalic HEART: normal rate RESP: normal effort ABDOMEN: Soft, non-tender, gravid appropriate for gestational age EXTREMITIES: Nontender, no edema NEURO: alert and oriented Dilation: 4.5 Effacement (%): 90   cx 4-5/95/-3, bulging  FHT:  Baseline 140 , moderate variability, accelerations present, no decelerations Contractions: UI, occ mild UC   Labs: Results for orders placed during the hospital encounter of 11/02/13 (from the past 24 hour(s))  URINALYSIS, ROUTINE W REFLEX MICROSCOPIC     Status: Abnormal   Collection Time    11/02/13 12:00 PM      Result Value Ref Range   Color, Urine YELLOW  YELLOW   APPearance CLEAR  CLEAR   Specific Gravity, Urine 1.010  1.005 - 1.030   pH 5.5  5.0 - 8.0   Glucose, UA NEGATIVE  NEGATIVE mg/dL   Hgb urine dipstick SMALL (*) NEGATIVE   Bilirubin Urine NEGATIVE  NEGATIVE   Ketones, ur NEGATIVE  NEGATIVE mg/dL   Protein, ur NEGATIVE  NEGATIVE mg/dL   Urobilinogen, UA 0.2  0.0 -  1.0 mg/dL   Nitrite NEGATIVE  NEGATIVE   Leukocytes, UA SMALL (*) NEGATIVE  URINE MICROSCOPIC-ADD ON     Status: Abnormal   Collection Time    11/02/13 12:00 PM      Result Value Ref Range   Squamous Epithelial / LPF MANY (*) RARE   WBC, UA 7-10  <3 WBC/hpf   RBC / HPF 3-6  <3 RBC/hpf   Bacteria, UA FEW (*) RARE  PROTEIN / CREATININE RATIO, URINE     Status: Abnormal   Collection Time    11/02/13 12:00 PM      Result Value Ref Range   Creatinine, Urine 50.52     Total Protein, Urine 13.3     Protein Creatinine Ratio 0.26 (*) 0.00 - 0.15  CBC     Status: None   Collection Time    11/02/13  1:32 PM      Result Value Ref Range   WBC 9.5  4.0 - 10.5 K/uL   RBC 4.67  3.87 - 5.11 MIL/uL   Hemoglobin 14.6  12.0 -  15.0 g/dL   HCT 16.1  09.6 - 04.5 %   MCV 86.9  78.0 - 100.0 fL   MCH 31.3  26.0 - 34.0 pg   MCHC 36.0  30.0 - 36.0 g/dL   RDW 40.9  81.1 - 91.4 %   Platelets 178  150 - 400 K/uL  COMPREHENSIVE METABOLIC PANEL     Status: Abnormal   Collection Time    11/02/13  1:32 PM      Result Value Ref Range   Sodium 135 (*) 137 - 147 mEq/L   Potassium 4.2  3.7 - 5.3 mEq/L   Chloride 99  96 - 112 mEq/L   CO2 22  19 - 32 mEq/L   Glucose, Bld 71  70 - 99 mg/dL   BUN 7  6 - 23 mg/dL   Creatinine, Ser 7.82  0.50 - 1.10 mg/dL   Calcium 9.0  8.4 - 95.6 mg/dL   Total Protein 6.2  6.0 - 8.3 g/dL   Albumin 2.7 (*) 3.5 - 5.2 g/dL   AST 21  0 - 37 U/L   ALT 14  0 - 35 U/L   Alkaline Phosphatase 147 (*) 39 - 117 U/L   Total Bilirubin 0.7  0.3 - 1.2 mg/dL   GFR calc non Af Amer >90  >90 mL/min   GFR calc Af Amer >90  >90 mL/min   Anion gap 14  5 - 15    Imaging:  No results found. MAU Course: D/W Dr. Marcelle Overlie preE labs and recheck cx if indicated. Dr. Marcelle Overlie aware of lab results and POC Pt elects to leave  after long wait for P:C result> will call with result  Assessment: 1. Pain of round ligament affecting pregnancy, antepartum   2. Abnormal dilatation of cervix before onset of labor, third trimester   G2P0100 at [redacted]w[redacted]d, not in established labor Category 1 FHR Elevated BPs  Plan: Discharge home with preE and labor precautions Labor precautions and fetal kick counts    Medication List         aspirin 81 MG chewable tablet  Chew by mouth daily.     prenatal multivitamin Tabs tablet  Take 1 tablet by mouth daily.          Follow-up Information   Follow up with MORRIS, MEGAN, DO. Schedule an appointment as soon as possible for a visit in 3 days. (BP  recheck)    Specialty:  Obstetrics and Gynecology   Contact information:   [address], [address] [place] [address] [phone]      Danae Orleans, CNM 11/02/2013 1:04 PM

## 2013-11-04 ENCOUNTER — Inpatient Hospital Stay (HOSPITAL_COMMUNITY)
Admission: AD | Admit: 2013-11-04 | Discharge: 2013-11-06 | DRG: 775 | Disposition: A | Discharge status: Home / Self Care | Payer: BC Managed Care – PPO | Source: Ambulatory Visit | Attending: Obstetrics and Gynecology | Admitting: Obstetrics and Gynecology

## 2013-11-04 ENCOUNTER — Inpatient Hospital Stay (HOSPITAL_COMMUNITY): Payer: BC Managed Care – PPO | Admitting: Anesthesiology

## 2013-11-04 ENCOUNTER — Encounter (HOSPITAL_COMMUNITY): Payer: Self-pay | Admitting: *Deleted

## 2013-11-04 ENCOUNTER — Encounter (HOSPITAL_COMMUNITY): Payer: BC Managed Care – PPO | Admitting: Anesthesiology

## 2013-11-04 DIAGNOSIS — O479 False labor, unspecified: Secondary | ICD-10-CM | POA: Diagnosis present

## 2013-11-04 DIAGNOSIS — O9989 Other specified diseases and conditions complicating pregnancy, childbirth and the puerperium: Secondary | ICD-10-CM

## 2013-11-04 DIAGNOSIS — Z6832 Body mass index (BMI) 32.0-32.9, adult: Secondary | ICD-10-CM | POA: Diagnosis not present

## 2013-11-04 DIAGNOSIS — O99892 Other specified diseases and conditions complicating childbirth: Secondary | ICD-10-CM | POA: Diagnosis present

## 2013-11-04 DIAGNOSIS — O99214 Obesity complicating childbirth: Secondary | ICD-10-CM

## 2013-11-04 DIAGNOSIS — Z22330 Carrier of Group B streptococcus: Secondary | ICD-10-CM | POA: Diagnosis not present

## 2013-11-04 DIAGNOSIS — E669 Obesity, unspecified: Secondary | ICD-10-CM | POA: Diagnosis present

## 2013-11-04 LAB — CBC
HCT: 43.1 % (ref 36.0–46.0)
Hemoglobin: 15.4 g/dL — ABNORMAL HIGH (ref 12.0–15.0)
MCH: 30.9 pg (ref 26.0–34.0)
MCHC: 35.7 g/dL (ref 30.0–36.0)
MCV: 86.5 fL (ref 78.0–100.0)
PLATELETS: 221 10*3/uL (ref 150–400)
RBC: 4.98 MIL/uL (ref 3.87–5.11)
RDW: 13.8 % (ref 11.5–15.5)
WBC: 9.9 10*3/uL (ref 4.0–10.5)

## 2013-11-04 LAB — RPR

## 2013-11-04 MED ORDER — PHENYLEPHRINE 40 MCG/ML (10ML) SYRINGE FOR IV PUSH (FOR BLOOD PRESSURE SUPPORT)
80.0000 ug | PREFILLED_SYRINGE | INTRAVENOUS | Status: DC | PRN
  Filled 2013-11-04: qty 2

## 2013-11-04 MED ORDER — SODIUM CHLORIDE 0.9 % IV SOLN
2.0000 g | Freq: Once | INTRAVENOUS | Status: AC
  Administered 2013-11-04: 2 g via INTRAVENOUS
  Filled 2013-11-04: qty 2000

## 2013-11-04 MED ORDER — DIPHENHYDRAMINE HCL 50 MG/ML IJ SOLN: 12.5000 mg | INTRAMUSCULAR | Status: DC | PRN

## 2013-11-04 MED ORDER — PHENYLEPHRINE 40 MCG/ML (10ML) SYRINGE FOR IV PUSH (FOR BLOOD PRESSURE SUPPORT)
80.0000 ug | PREFILLED_SYRINGE | INTRAVENOUS | Status: DC | PRN
  Filled 2013-11-04: qty 10
  Filled 2013-11-04: qty 2

## 2013-11-04 MED ORDER — PROMETHAZINE HCL 25 MG/ML IJ SOLN: 12.5000 mg | Freq: Four times a day (QID) | INTRAMUSCULAR | Status: DC | PRN

## 2013-11-04 MED ORDER — OXYCODONE-ACETAMINOPHEN 5-325 MG PO TABS: 1.0000 | ORAL_TABLET | ORAL | Status: DC | PRN

## 2013-11-04 MED ORDER — LANOLIN HYDROUS EX OINT
TOPICAL_OINTMENT | CUTANEOUS | Status: DC | PRN
Start: 2013-11-04 — End: 2013-11-06

## 2013-11-04 MED ORDER — SENNOSIDES-DOCUSATE SODIUM 8.6-50 MG PO TABS
2.0000 | ORAL_TABLET | ORAL | Status: DC
  Administered 2013-11-05 (×2): 2 via ORAL
  Filled 2013-11-04 (×2): qty 2

## 2013-11-04 MED ORDER — WITCH HAZEL-GLYCERIN EX PADS: 1.0000 "application " | MEDICATED_PAD | CUTANEOUS | Status: DC | PRN

## 2013-11-04 MED ORDER — MEASLES, MUMPS & RUBELLA VAC ~~LOC~~ INJ: 0.5000 mL | INJECTION | Freq: Once | SUBCUTANEOUS | Status: DC

## 2013-11-04 MED ORDER — BENZOCAINE-MENTHOL 20-0.5 % EX AERO
1.0000 "application " | INHALATION_SPRAY | CUTANEOUS | Status: DC | PRN
  Filled 2013-11-04: qty 56

## 2013-11-04 MED ORDER — DIPHENHYDRAMINE HCL 25 MG PO CAPS: 25.0000 mg | ORAL_CAPSULE | Freq: Four times a day (QID) | ORAL | Status: DC | PRN

## 2013-11-04 MED ORDER — ONDANSETRON HCL 4 MG/2ML IJ SOLN: 4.0000 mg | Freq: Four times a day (QID) | INTRAMUSCULAR | Status: DC | PRN

## 2013-11-04 MED ORDER — ONDANSETRON HCL 4 MG/2ML IJ SOLN: 4.0000 mg | INTRAMUSCULAR | Status: DC | PRN

## 2013-11-04 MED ORDER — BUTORPHANOL TARTRATE 1 MG/ML IJ SOLN
INTRAMUSCULAR | Status: AC
Start: 2013-11-04 — End: 2013-11-05
  Filled 2013-11-04: qty 1

## 2013-11-04 MED ORDER — SIMETHICONE 80 MG PO CHEW
80.0000 mg | CHEWABLE_TABLET | ORAL | Status: DC | PRN
Start: 2013-11-04 — End: 2013-11-06

## 2013-11-04 MED ORDER — LACTATED RINGERS IV SOLN: INTRAVENOUS | Status: DC

## 2013-11-04 MED ORDER — LIDOCAINE HCL (PF) 1 % IJ SOLN
30.0000 mL | INTRAMUSCULAR | Status: DC | PRN
Start: 2013-11-04 — End: 2013-11-06
  Administered 2013-11-04: 30 mL via SUBCUTANEOUS
  Filled 2013-11-04: qty 30

## 2013-11-04 MED ORDER — ACETAMINOPHEN 325 MG PO TABS: 650.0000 mg | ORAL_TABLET | ORAL | Status: DC | PRN

## 2013-11-04 MED ORDER — FENTANYL 2.5 MCG/ML BUPIVACAINE 1/10 % EPIDURAL INFUSION (WH - ANES)
14.0000 mL/h | INTRAMUSCULAR | Status: DC | PRN
  Filled 2013-11-04: qty 125

## 2013-11-04 MED ORDER — EPHEDRINE 5 MG/ML INJ
10.0000 mg | INTRAVENOUS | Status: DC | PRN
  Filled 2013-11-04: qty 2

## 2013-11-04 MED ORDER — LACTATED RINGERS IV SOLN: 500.0000 mL | Freq: Once | INTRAVENOUS | Status: DC

## 2013-11-04 MED ORDER — LACTATED RINGERS IV SOLN: 500.0000 mL | INTRAVENOUS | Status: DC | PRN

## 2013-11-04 MED ORDER — OXYTOCIN BOLUS FROM INFUSION
500.0000 mL | INTRAVENOUS | Status: DC
  Administered 2013-11-04: 500 mL via INTRAVENOUS

## 2013-11-04 MED ORDER — BUTORPHANOL TARTRATE 1 MG/ML IJ SOLN
1.0000 mg | Freq: Once | INTRAMUSCULAR | Status: AC
  Administered 2013-11-04: 1 mg via INTRAVENOUS

## 2013-11-04 MED ORDER — DIBUCAINE 1 % RE OINT: 1.0000 "application " | TOPICAL_OINTMENT | RECTAL | Status: DC | PRN

## 2013-11-04 MED ORDER — OXYCODONE-ACETAMINOPHEN 5-325 MG PO TABS: 2.0000 | ORAL_TABLET | ORAL | Status: DC | PRN

## 2013-11-04 MED ORDER — IBUPROFEN 600 MG PO TABS
600.0000 mg | ORAL_TABLET | Freq: Four times a day (QID) | ORAL | Status: DC
  Administered 2013-11-05 – 2013-11-06 (×7): 600 mg via ORAL
  Filled 2013-11-04 (×7): qty 1

## 2013-11-04 MED ORDER — OXYTOCIN 40 UNITS IN LACTATED RINGERS INFUSION - SIMPLE MED
62.5000 mL/h | INTRAVENOUS | Status: DC
  Filled 2013-11-04: qty 1000

## 2013-11-04 MED ORDER — LIDOCAINE HCL (PF) 1 % IJ SOLN
INTRAMUSCULAR | Status: DC | PRN
  Administered 2013-11-04 (×2): 4 mL

## 2013-11-04 MED ORDER — CITRIC ACID-SODIUM CITRATE 334-500 MG/5ML PO SOLN: 30.0000 mL | ORAL | Status: DC | PRN

## 2013-11-04 MED ORDER — PRENATAL MULTIVITAMIN CH
1.0000 | ORAL_TABLET | Freq: Every day | ORAL | Status: DC
  Administered 2013-11-05 – 2013-11-06 (×2): 1 via ORAL
  Filled 2013-11-04 (×2): qty 1

## 2013-11-04 MED ORDER — FENTANYL 2.5 MCG/ML BUPIVACAINE 1/10 % EPIDURAL INFUSION (WH - ANES)
INTRAMUSCULAR | Status: DC | PRN
  Administered 2013-11-04: 14 mL/h via EPIDURAL

## 2013-11-04 MED ORDER — MEDROXYPROGESTERONE ACETATE 150 MG/ML IM SUSP: 150.0000 mg | INTRAMUSCULAR | Status: DC | PRN

## 2013-11-04 MED ORDER — INFLUENZA VAC SPLIT QUAD 0.5 ML IM SUSY
0.5000 mL | PREFILLED_SYRINGE | INTRAMUSCULAR | Status: DC
  Filled 2013-11-04: qty 0.5

## 2013-11-04 MED ORDER — ONDANSETRON HCL 4 MG PO TABS: 4.0000 mg | ORAL_TABLET | ORAL | Status: DC | PRN

## 2013-11-04 MED ORDER — TETANUS-DIPHTH-ACELL PERTUSSIS 5-2.5-18.5 LF-MCG/0.5 IM SUSP: 0.5000 mL | Freq: Once | INTRAMUSCULAR | Status: DC

## 2013-11-04 NOTE — Progress Notes (Signed)
Message left to return call.

## 2013-11-04 NOTE — Anesthesia Procedure Notes (Signed)
Epidural Patient location during procedure: OB Start time: 11/04/2013 3:42 PM  Staffing Anesthesiologist: Terilyn Sano A. Performed by: anesthesiologist   Preanesthetic Checklist Completed: patient identified, site marked, surgical consent, pre-op evaluation, timeout performed, IV checked, risks and benefits discussed and monitors and equipment checked  Epidural Patient position: right lateral decubitus Prep: site prepped and draped and DuraPrep Patient monitoring: continuous pulse ox and blood pressure Approach: midline Location: L4-L5 Injection technique: LOR air  Needle:  Needle type: Tuohy  Needle gauge: 17 G Needle length: 9 cm and 9 Needle insertion depth: 5 cm cm Catheter type: closed end flexible Catheter size: 19 Gauge Catheter at skin depth: 10 cm Test dose: negative and Other  Assessment Events: blood not aspirated, injection not painful, no injection resistance, negative IV test and no paresthesia  Additional Notes Patient identified. Risks and benefits discussed including failed block, incomplete  Pain control, post dural puncture headache, nerve damage, paralysis, blood pressure Changes, nausea, vomiting, reactions to medications-both toxic and allergic and post Partum back pain. All questions were answered. Patient expressed understanding and wished to proceed. Sterile technique was used throughout procedure. Epidural site was Dressed with sterile barrier dressing. No paresthesias, signs of intravascular injection Or signs of intrathecal spread were encountered.  Patient was more comfortable after the epidural was dosed. Please see RN's note for documentation of vital signs and FHR which are stable.

## 2013-11-04 NOTE — Anesthesia Preprocedure Evaluation (Addendum)

## 2013-11-04 NOTE — H&P (Signed)
Donna Mitchell is a 33 y.o. female presenting for labor.  ROM, clear fluid upon arrival to L&D.    History OB History   Grav Para Term Preterm Abortions TAB SAB Ect Mult Living       Past Medical History  Diagnosis Date  . No pertinent past medical history    Past Surgical History  Procedure Laterality Date  . Tonsillectomy     Family History: family history is not on file. Social History:  reports that she has never smoked. She does not have any smokeless tobacco history on file. She reports that she does not drink alcohol or use illicit drugs.   Prenatal Transfer Tool  Maternal Diabetes: No Genetic Screening: Normal Maternal Ultrasounds/Referrals: Normal Fetal Ultrasounds or other Referrals:  None Maternal Substance Abuse:  No Significant Maternal Medications:  None Significant Maternal Lab Results:  None Other Comments:  None  ROS  Dilation: 9 Effacement (%): 100 Station: 0 Exam by:: Sowder, RN Blood pressure 136/114, pulse 93, temperature 97.7 F (36.5 C), temperature source Axillary, resp. rate 20, height  (1.651 m), weight 87.091 kg (192 lb), last menstrual period 01/28/2013. Exam Physical Exam  Gen - uncomfortable w/ ctx Abd - gravid, NT Ext - NT Cvx 9cm now  Prenatal labs: ABO, Rh:   Antibody:   Rubella:   RPR:    HBsAg:    HIV:    GBS: positive    Assessment/Plan: Admit Epidural prn Exp mngt Ampicillin for GBS   Refael Fulop 11/04/2013, 3:41 PM

## 2013-11-04 NOTE — Progress Notes (Signed)
SVD of vigorous female infant w/ apgars of 9,9.  Placenta delivered spontaneous w/ 3VC.   2nd degree lac repaired w/ 3-0 vicryl rapide.  Fundus firm.  EBL 400cc .

## 2013-11-05 LAB — CBC
HEMATOCRIT: 35.2 % — AB (ref 36.0–46.0)
HEMOGLOBIN: 12.3 g/dL (ref 12.0–15.0)
MCH: 30.4 pg (ref 26.0–34.0)
MCHC: 34.9 g/dL (ref 30.0–36.0)
MCV: 87.1 fL (ref 78.0–100.0)
Platelets: 192 10*3/uL (ref 150–400)
RBC: 4.04 MIL/uL (ref 3.87–5.11)
RDW: 13.8 % (ref 11.5–15.5)
WBC: 14.2 10*3/uL — ABNORMAL HIGH (ref 4.0–10.5)

## 2013-11-05 NOTE — Lactation Note (Signed)
This note was copied from the chart of Donna Loyda Costin. Lactation Consultation Note: Initial visit with mom. She reports that baby has been feeding well- last feeding nursed for 25 min with LS of 9 by RN. Baby asleep in mom's arms at present. BF brochure given. Reviewed BFSG and OP appointments as resources for support after DC. Dad asking about diet while breast feeding- reviewed with parents. Mom has Medela pump for home No further questions at present. To call prn  Patient Name: Donna Mitchell [id number] Date: 11/05/2013 Reason for consult: Initial assessment   Maternal Data Formula Feeding for Exclusion: No Does the patient have breastfeeding experience prior to this delivery?: No  Feeding   LATCH Score/Interventions     Lactation Tools Discussed/Used     Consult Status Consult Status: Follow-up Date: 11/06/13 Follow-up type: In-patient    Pamelia Hoit 11/05/2013, 11:27 AM

## 2013-11-05 NOTE — Progress Notes (Signed)
Post Partum Day 1 Subjective: no complaints, up ad lib, voiding and tolerating PO  Objective: Blood pressure 153/92, pulse 80, temperature 98.5 F (36.9 C), temperature source Oral, resp. rate 18, height  (1.651 m), weight 192 lb (87.091 kg), last menstrual period 01/28/2013, SpO2 98.00%, unknown if currently breastfeeding.  Physical Exam:  General: alert and cooperative Lochia: appropriate Uterine Fundus: firm Incision: perineum intact DVT Evaluation: No evidence of DVT seen on physical exam. Negative Homan's sign. No cords or calf tenderness. No significant calf/ankle edema.   Recent Labs  11/04/13 1445 11/05/13 0545  HGB 15.4* 12.3  HCT 43.1 35.2*    Assessment/Plan: Plan for discharge tomorrow   LOS: 1 day   CURTIS,CAROL G 11/05/2013, 8:06 AM

## 2013-11-05 NOTE — Anesthesia Postprocedure Evaluation (Signed)
  Anesthesia Post-op Note  Patient: Donna Mitchell  Procedure(s) Performed: * No procedures listed *  Patient Location: PACU and Mother/Baby  Anesthesia Type:Epidural  Level of Consciousness: awake, alert  and oriented  Airway and Oxygen Therapy: Patient Spontanous Breathing  Post-op Pain: mild  Post-op Assessment: Post-op Vital signs reviewed  Post-op Vital Signs: Reviewed and stable  Last Vitals:  Filed Vitals:   11/05/13 1142  BP: 140/91  Pulse: 70  Temp: 36.9 C  Resp: 18    Complications: No apparent anesthesia complications

## 2013-11-06 MED ORDER — INFLUENZA VAC SPLIT QUAD 0.5 ML IM SUSY
0.5000 mL | PREFILLED_SYRINGE | INTRAMUSCULAR | Status: AC
  Administered 2013-11-06: 0.5 mL via INTRAMUSCULAR
  Filled 2013-11-06: qty 0.5

## 2013-11-06 MED ORDER — IBUPROFEN 600 MG PO TABS: 600.0000 mg | ORAL_TABLET | Freq: Four times a day (QID) | ORAL | Status: AC

## 2013-11-06 NOTE — Discharge Summary (Signed)
Obstetric Discharge Summary Reason for Admission: rupture of membranes Prenatal Procedures: ultrasound Intrapartum Procedures: spontaneous vaginal delivery Postpartum Procedures: none Complications-Operative and Postpartum: 2 degree perineal laceration Hemoglobin  Date Value Ref Range Status  11/05/2013 12.3  12.0 - 15.0 g/dL Final     DELTA CHECK NOTED     REPEATED TO VERIFY     HCT  Date Value Ref Range Status  11/05/2013 35.2* 36.0 - 46.0 % Final    Physical Exam:  General: alert, cooperative and appears stated age Lochia: appropriate Uterine Fundus: firm Incision: healing well DVT Evaluation: No evidence of DVT seen on physical exam. Negative Homan's sign. No cords or calf tenderness. No significant calf/ankle edema.  Discharge Diagnoses: Term Pregnancy-delivered  Discharge Information: Date: 11/06/2013 Activity: pelvic rest Diet: routine Medications: PNV and Ibuprofen Condition: stable Instructions: refer to practice specific booklet Discharge to: home   Newborn Data: Live born female  Birth Weight: 7 lb 10.2 oz (3465 g) APGAR: 9, 9  Home with mother.  CURTIS,CAROL G 11/06/2013, 8:26 AM

## 2013-12-16 ENCOUNTER — Other Ambulatory Visit: Payer: Self-pay | Admitting: Obstetrics & Gynecology

## 2013-12-16 ENCOUNTER — Encounter (HOSPITAL_COMMUNITY): Payer: Self-pay | Admitting: *Deleted

## 2013-12-17 LAB — CYTOLOGY - PAP

## 2016-03-29 IMAGING — US US MFM OB TRANSVAGINAL
1 series · 13 of 17 positions shown · non-contrast
Comparison: none

[Series 1: us mfm ob transvaginal · 0.23mm/px · 13 of 17 slices shown]
[im 1/17]
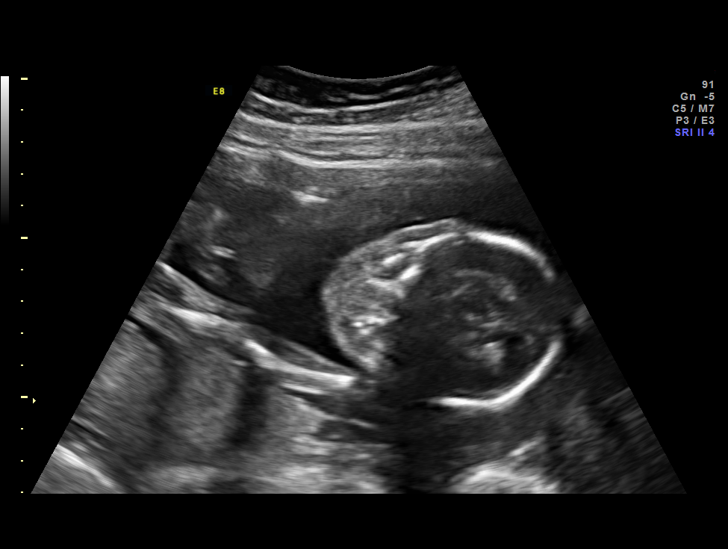
[im 2/17]
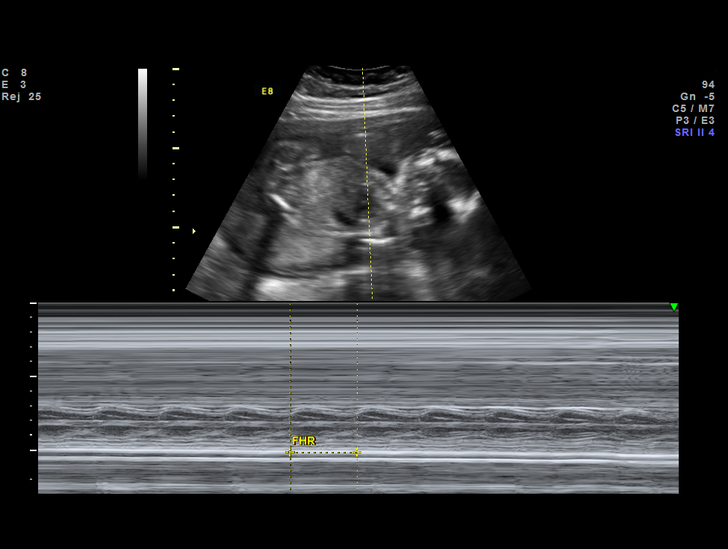
[im 4/17]
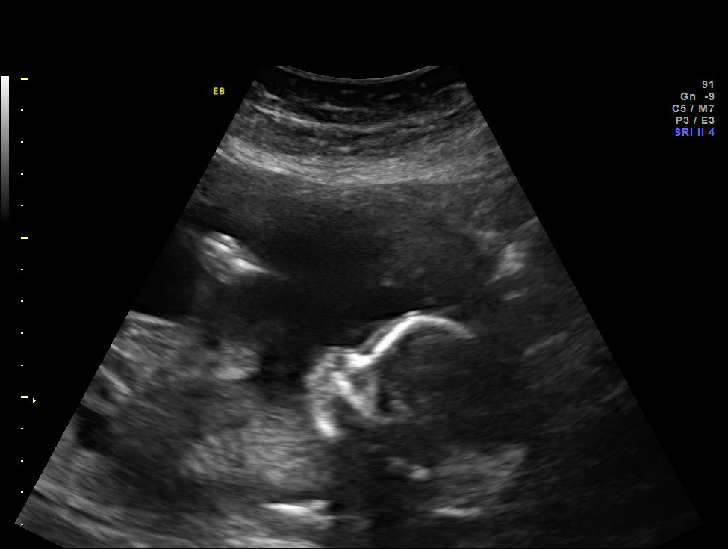
[im 5/17]
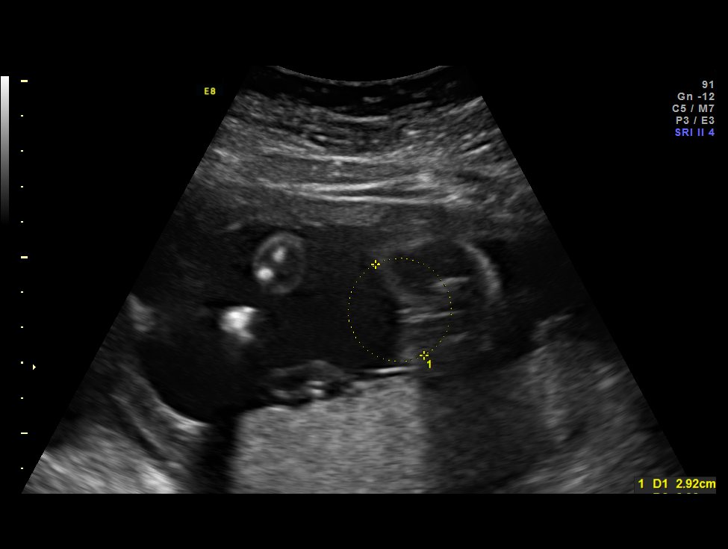
[im 6/17]
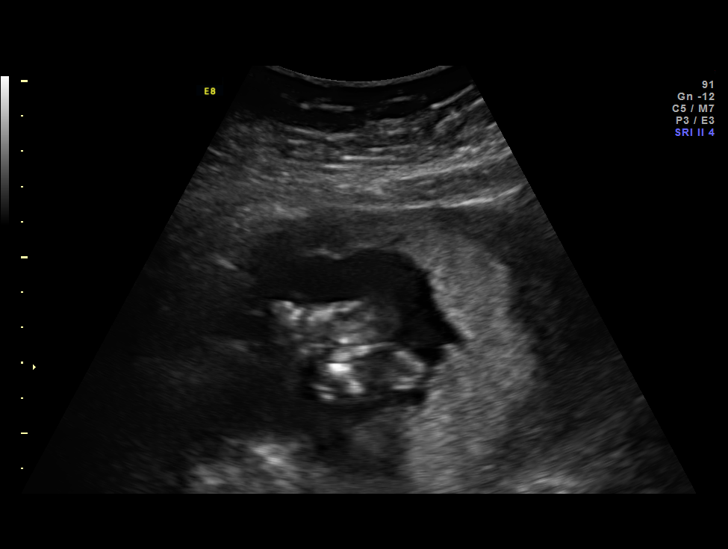
[im 8/17]
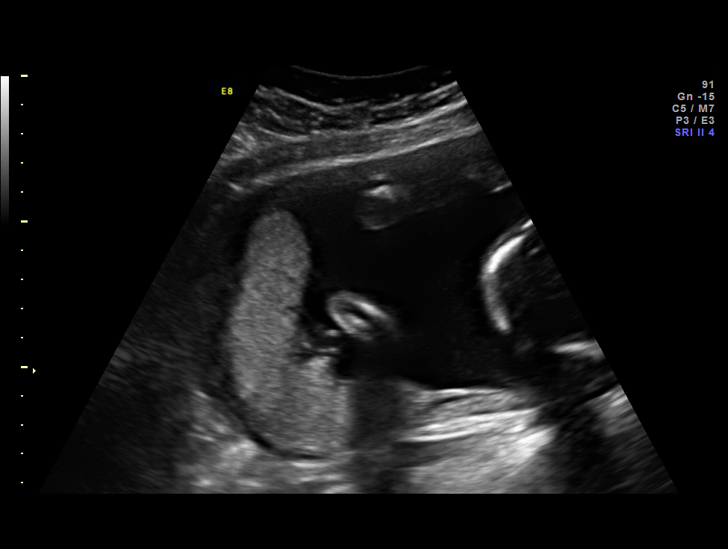
[im 9/17]
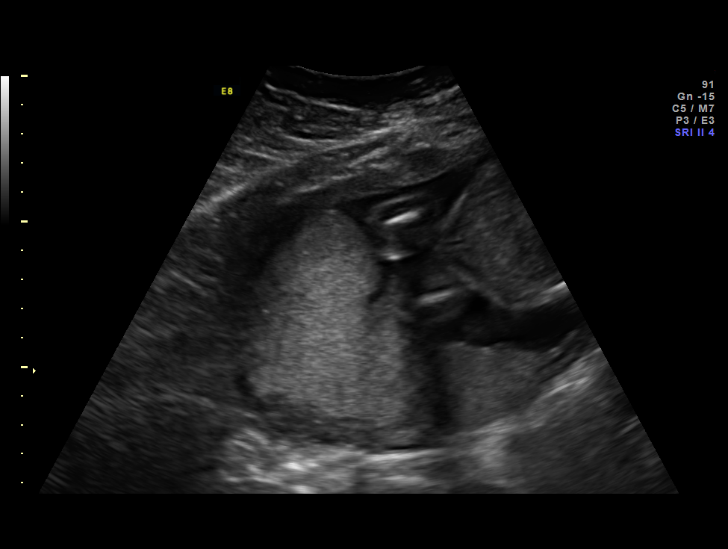
[im 10/17]
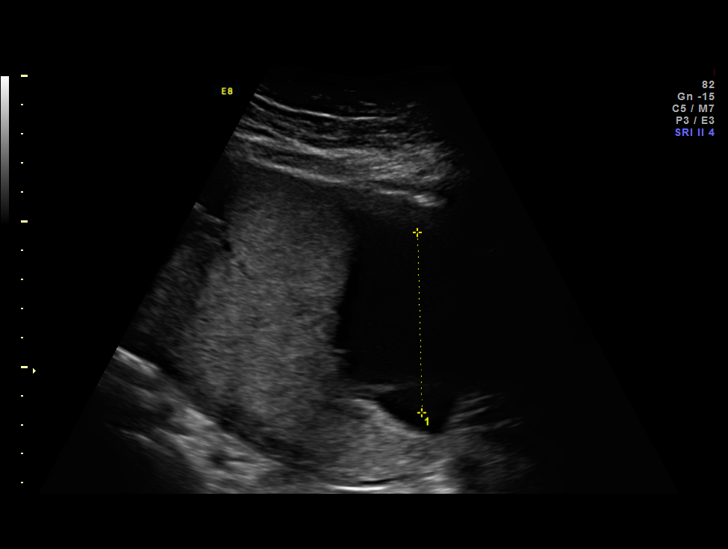
[im 12/17]
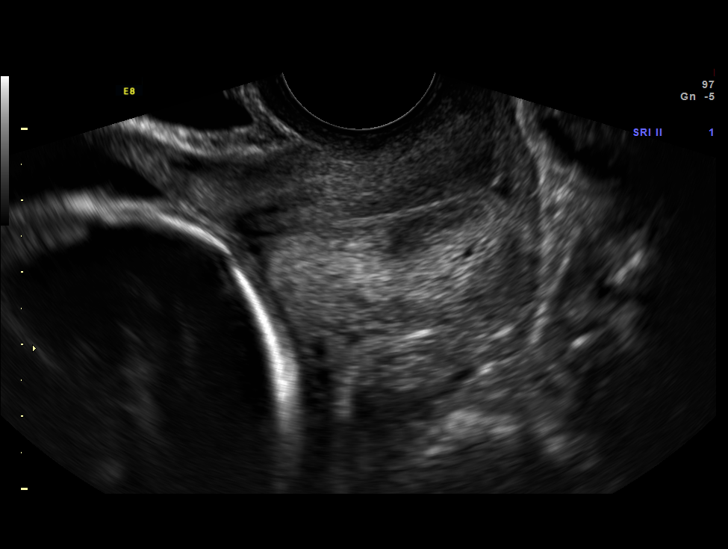
[im 13/17]
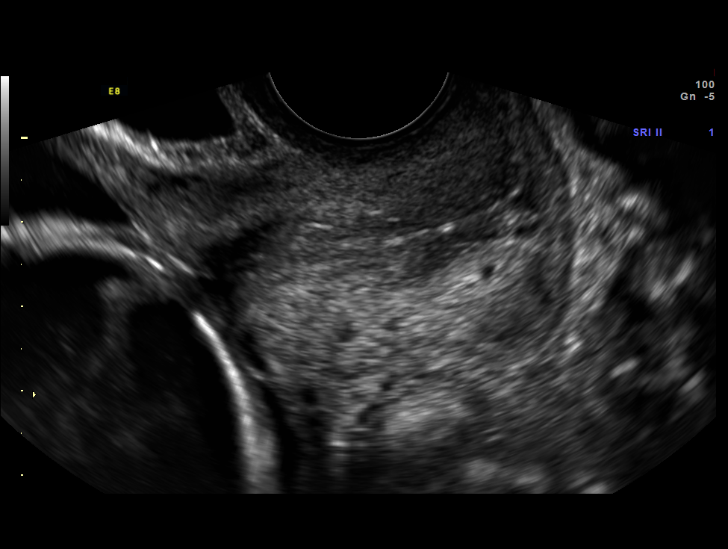
[im 14/17]
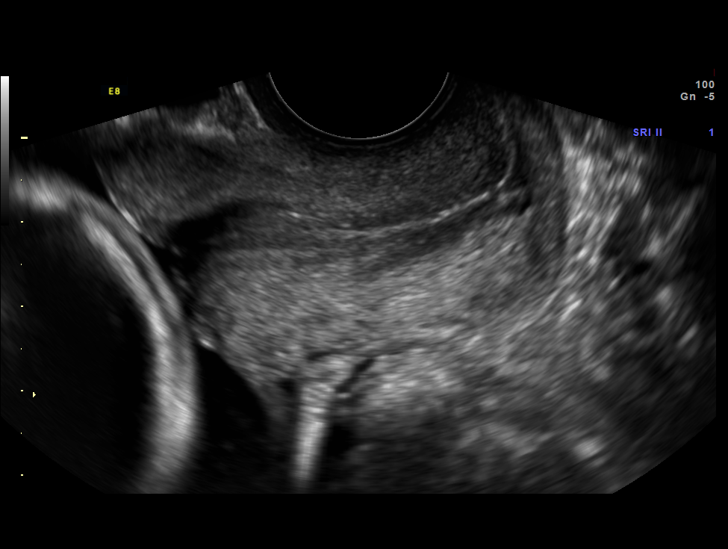
[im 16/17]
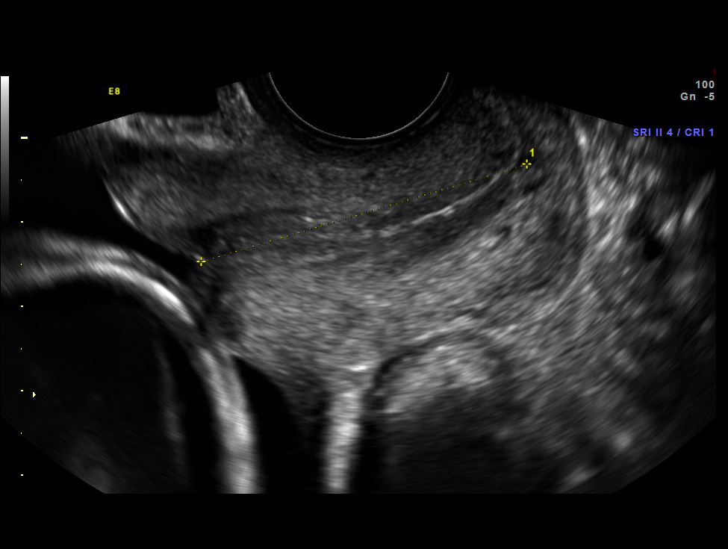
[im 17/17]
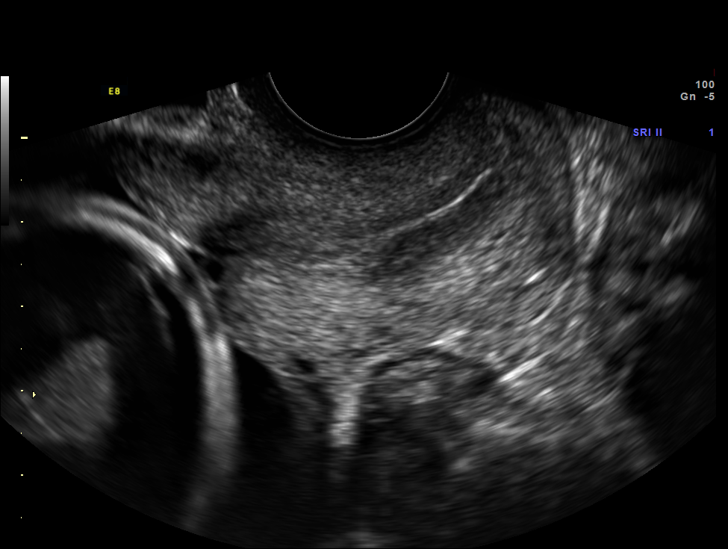

[13 of 17 positions shown; findings below may reference images not displayed]

OBSTETRICS REPORT
                      (Signed Final 07/09/2013 [DATE])

Service(s) Provided

 US MFM OB TRANSVAGINAL                                76817.2
Indications

 Poor obstetric history: Previous midtrimester loss
 (21 weeks); 17P and ASA
Fetal Evaluation

 Num Of Fetuses:    1
 Fetal Heart Rate:  150                          bpm
 Cardiac Activity:  Observed
 Presentation:      Cephalic
 Placenta:          Posterior, above cervical
                    os
 P. Cord            Previously Visualized
 Insertion:

 Amniotic Fluid
 AFI FV:      Subjectively within normal limits
Gestational Age

 LMP:           23w 1d        Date:  01/28/13                 EDD:   11/04/13
 Best:          22w 0d     Det. By:  Early Ultrasound         EDD:   11/12/13
                                     (03/21/13)
Cervix Uterus Adnexa

 Cervical Length:    4        cm

 Cervix:       Normal appearance by transvaginal scan Appears
               closed, without funnelling.
Impression

 Single IUP at 22w3d
 Hx of prior mid trimester loss
 Limited ultrasound preformed for cervical length
 TVUS - cervical length 4 cm without funneling or dynamic
 changes
 Normal amniotic fluid volume
Recommendations

 Continue serial cervical length surveillance at least until 24
 weeks.
 Please contact our office if you would prefer that these
 studies be performed with MFM.

## 2017-03-20 ENCOUNTER — Emergency Department (HOSPITAL_COMMUNITY)
Admission: EM | Admit: 2017-03-20 | Discharge: 2017-03-20 | Disposition: A | Discharge status: Home / Self Care | Payer: BLUE CROSS/BLUE SHIELD | Attending: Emergency Medicine | Admitting: Emergency Medicine

## 2017-03-20 ENCOUNTER — Encounter (HOSPITAL_COMMUNITY): Payer: Self-pay | Admitting: Emergency Medicine

## 2017-03-20 ENCOUNTER — Other Ambulatory Visit: Payer: Self-pay

## 2017-03-20 ENCOUNTER — Emergency Department (HOSPITAL_COMMUNITY): Payer: BLUE CROSS/BLUE SHIELD

## 2017-03-20 DIAGNOSIS — M25512 Pain in left shoulder: Secondary | ICD-10-CM | POA: Diagnosis present

## 2017-03-20 DIAGNOSIS — Z79899 Other long term (current) drug therapy: Secondary | ICD-10-CM | POA: Diagnosis not present

## 2017-03-20 MED ORDER — DIAZEPAM 5 MG PO TABS
5.0000 mg | ORAL_TABLET | Freq: Once | ORAL | Status: AC
  Administered 2017-03-20: 5 mg via ORAL
  Filled 2017-03-20: qty 1

## 2017-03-20 MED ORDER — KETOROLAC TROMETHAMINE 30 MG/ML IJ SOLN
30.0000 mg | Freq: Once | INTRAMUSCULAR | Status: AC
  Administered 2017-03-20: 30 mg via INTRAMUSCULAR
  Filled 2017-03-20: qty 1

## 2017-03-20 MED ORDER — METHOCARBAMOL 750 MG PO TABS: 750.0000 mg | ORAL_TABLET | Freq: Three times a day (TID) | ORAL | 0 refills | Status: AC | PRN

## 2017-03-20 MED ORDER — IBUPROFEN 400 MG PO TABS
400.0000 mg | ORAL_TABLET | Freq: Once | ORAL | Status: AC | PRN
  Administered 2017-03-20: 400 mg via ORAL
  Filled 2017-03-20: qty 1

## 2017-03-20 MED ORDER — MELOXICAM 7.5 MG PO TABS: 7.5000 mg | ORAL_TABLET | Freq: Every day | ORAL | 0 refills | Status: AC

## 2017-03-20 NOTE — ED Provider Notes (Signed)
[facility] EMERGENCY DEPARTMENT Provider Note   CSN: [id number] Arrival date & time: 03/20/17  0350     History   Chief Complaint Chief Complaint  Patient presents with  . Shoulder Pain    HPI Donna Mitchell is a 37 y.o. female with no significant past medical history who presents today for evaluation of left-sided neck/shoulder pain.  She reports that approximately 1 week ago she had a "crick in her neck" and that it has been gradually getting worse.  She denies any fevers, no recent trauma.  She denies any numbness or tingling of her arm.  She denies any shortness of breath, chest pain, or right arm pain.    HPI  Past Medical History:  Diagnosis Date  . No pertinent past medical history     Patient Active Problem List   Diagnosis Date Noted  . Indication for care in labor or delivery 11/04/2013  . SVD (spontaneous vaginal delivery) 11/04/2013  . Premature labor with rupture of membranes 02/18/2012    Class: Present on Admission  . Placental abruption, antepartum 02/18/2012    Class: Present on Admission  . Status post vaginal delivery 02/18/2012    Class: Status post    Past Surgical History:  Procedure Laterality Date  . TONSILLECTOMY      OB History    Gravida Para Term Preterm AB Living   2 2 1 1  0 1   SAB TAB Ectopic Multiple Live Births   0 0 0 0 1       Home Medications    Prior to Admission medications   Medication Sig Start Date End Date Taking? Authorizing Provider  ibuprofen (ADVIL,MOTRIN) 600 MG tablet Take 1 tablet (600 mg total) by mouth every 6 (six) hours. 11/06/13   Julio Sicks, NP  meloxicam (MOBIC) 7.5 MG tablet Take 1-2 tablets (7.5-15 mg total) by mouth daily. 03/20/17   Cristina Gong, PA-C  methocarbamol (ROBAXIN) 750 MG tablet Take 1-2 tablets (750-1,500 mg total) by mouth 3 (three) times daily as needed for muscle spasms. 03/20/17   Cristina Gong, PA-C  Prenatal Vit-Fe Fumarate-FA (PRENATAL  MULTIVITAMIN) TABS Take 1 tablet by mouth daily.    [provider]    Family History No family history on file.  Social History Social History   Tobacco Use  . Smoking status: Never Smoker  . Smokeless tobacco: Never Used  Substance Use Topics  . Alcohol use: No  . Drug use: No     Allergies   Patient has no known allergies.   Review of Systems Review of Systems  Constitutional: Negative for chills and fever.  Respiratory: Negative for cough, chest tightness and shortness of breath.   Cardiovascular: Negative for chest pain.  Gastrointestinal: Negative for abdominal pain.  Musculoskeletal: Positive for myalgias and neck pain. Negative for joint swelling and neck stiffness.  Skin: Negative for color change and wound.  Neurological: Negative for weakness, numbness and headaches.     Physical Exam Updated Vital Signs BP (!) 151/82   Pulse 63   Temp (!) 97.5 F (36.4 C) (Oral)   Resp 17   Ht 5\' 5"  (1.651 m)   Wt 75.8 kg (167 lb)   SpO2 100%   BMI 27.79 kg/m   Physical Exam  Constitutional: She appears well-developed and well-nourished.  Neck:  There is left-sided paraspinal tenderness, the muscles feel tight consistent with spasm.  There is no midline tenderness.    Cardiovascular: Normal rate  and intact distal pulses.  2+ radial pulses bilaterally, bilateral upper extremities are warm and well perfused.  Pulmonary/Chest: Effort normal and breath sounds normal. No respiratory distress. She has no wheezes. She exhibits no tenderness.  Musculoskeletal:  5/5 grip strength in bilateral upper extremities.  There is no obvious swelling, crepitus, or deformity to left arm/shoulder. There is diffuse tenderness to palpation over the general distribution of the left trapezius muscle.  Palpation in these areas both re-creates and exacerbates her reported pain.  Neurological:  Sensation intact and symmetrical to bilateral upper extremities.  Skin: Skin is warm and  dry. She is not diaphoretic.  Psychiatric: She has a normal mood and affect. Her behavior is normal.  Nursing note and vitals reviewed.    ED Treatments / Results  Labs (all labs ordered are listed, but only abnormal results are displayed) Labs Reviewed - No data to display  EKG  EKG Interpretation None       Radiology Dg Shoulder Left  Result Date: 03/20/2017 CLINICAL DATA:  Neck pain for 1 week, now with pain in LEFT shoulder. EXAM: LEFT SHOULDER - 2+ VIEW COMPARISON:  None. FINDINGS: The humeral head is well-formed and located. The subacromial, glenohumeral and acromioclavicular joint spaces are intact. No destructive bony lesions. Soft tissue planes are non-suspicious. IMPRESSION: Negative. Electronically Signed   By: Awilda Metroourtnay  Bloomer M.D.   On: 03/20/2017 04:30    Procedures Procedures (including critical care time)  Medications Ordered in ED Medications  diazepam (VALIUM) tablet 5 mg (not administered)  ketorolac (TORADOL) 30 MG/ML injection 30 mg (not administered)  ibuprofen (ADVIL,MOTRIN) tablet 400 mg (400 mg Oral Given 03/20/17 0405)     Initial Impression / Assessment and Plan / ED Course  I have reviewed the triage vital signs and the nursing notes.  Pertinent labs & imaging results that were available during my care of the patient were reviewed by me and considered in my medical decision making (see chart for details).    Donna Mitchell presents today for evaluation of left-sided shoulder pain.  History and physical consistent with acute torticollis.  Her pain is both re-created and exacerbated with palpation over the left trapezius muscle.  There is no midline tenderness, no recent trauma, no fevers chills, or shortness of breath.  This is been going on for approximately 1 week, I do not suspect occult pneumothorax or lung pathology.  She was treated in the emergency room with Toradol, Valium, and given prescription for Robaxin and Mobic.  She was instructed  that the medication she received here and is being given prescriptions for may make her drowsy or sleepy.  Work note given.  Discharged home with PCP follow-up, return precautions discussed.    Final Clinical Impressions(s) / ED Diagnoses   Final diagnoses:  Acute pain of left shoulder    ED Discharge Orders        Ordered    methocarbamol (ROBAXIN) 750 MG tablet  3 times daily PRN     03/20/17 0917    meloxicam (MOBIC) 7.5 MG tablet  Daily     03/20/17 0917       Cristina GongHammond, Jediah Horger W, PA-C 03/20/17 0940    Raeford RazorKohut, Stephen, MD 03/20/17 1056

## 2017-03-20 NOTE — ED Triage Notes (Signed)
Reports having a crick in her neck a week ago.  Woke this morning with pain in shoulder blade to left shoulder.  Reports taking tylenol for pain and the pain has decreased some at this time.

## 2017-03-20 NOTE — Discharge Instructions (Signed)
I have given you a prescription for meloxicam (mobic).  While taking this medication do not take any other NSAID medications such as Advil, motrin, aleve, naproxen.  Please make sure to monitor your stools for dark tarry bowel movements, if you have these stop taking mobic and call your doctor.   You may take tylenol while taking mobic.  Please take Tylenol (acetaminophen) to relieve your pain.  You may take tylenol, up to 1,000 mg (two extra strength pills).  Do not take more than 3,000 mg tylenol in a 24 hour period.  Please check all medication labels as many medications such as pain and cold medications may contain tylenol. Please do not drink alcohol while taking this medication.   Today you received medications that may make you sleepy or impair your ability to make decisions.  For the next 24 hours please do not drive, operate heavy machinery, care for a small child with out another adult present, or perform any activities that may cause harm to you or someone else if you were to fall asleep or be impaired.   You are being prescribed a medication which may make you sleepy. Please follow up of listed precautions for at least 24 hours after taking one dose.  The best way to get rid of muscle pain is by taking NSAIDS, using heat, massage therapy, and gentle stretching/range of motion exercises.  If you become pregnant please stop taking these medications.

## 2017-03-30 ENCOUNTER — Other Ambulatory Visit: Payer: Self-pay | Admitting: Internal Medicine

## 2017-03-30 DIAGNOSIS — M542 Cervicalgia: Secondary | ICD-10-CM

## 2017-04-02 ENCOUNTER — Ambulatory Visit
Admission: RE | Admit: 2017-04-02 | Discharge: 2017-04-02 | Disposition: A | Discharge status: Home / Self Care | Payer: BLUE CROSS/BLUE SHIELD | Source: Ambulatory Visit | Attending: Internal Medicine | Admitting: Internal Medicine

## 2017-04-02 DIAGNOSIS — M542 Cervicalgia: Secondary | ICD-10-CM

## 2018-12-14 ENCOUNTER — Other Ambulatory Visit: Payer: Self-pay

## 2018-12-14 DIAGNOSIS — Z20822 Contact with and (suspected) exposure to covid-19: Secondary | ICD-10-CM

## 2018-12-15 LAB — NOVEL CORONAVIRUS, NAA: SARS-CoV-2, NAA: NOT DETECTED

## 2021-08-16 ENCOUNTER — Other Ambulatory Visit: Payer: Self-pay | Admitting: Obstetrics & Gynecology

## 2021-08-16 DIAGNOSIS — R928 Other abnormal and inconclusive findings on diagnostic imaging of breast: Secondary | ICD-10-CM

## 2021-08-19 ENCOUNTER — Ambulatory Visit
Admission: RE | Admit: 2021-08-19 | Discharge: 2021-08-19 | Disposition: A | Discharge status: Home / Self Care | Payer: BC Managed Care – PPO | Source: Ambulatory Visit | Attending: Obstetrics & Gynecology | Admitting: Obstetrics & Gynecology

## 2021-08-19 ENCOUNTER — Ambulatory Visit: Payer: BLUE CROSS/BLUE SHIELD

## 2021-08-19 DIAGNOSIS — R928 Other abnormal and inconclusive findings on diagnostic imaging of breast: Secondary | ICD-10-CM

## 2021-08-30 ENCOUNTER — Other Ambulatory Visit: Payer: BLUE CROSS/BLUE SHIELD
